# Patient Record
Sex: Female | Born: 1940 | Race: Black or African American | Hispanic: No | Marital: Single
Health system: Southern US, Community
[De-identification: ages and names within clinical notes are randomized; demographics above are authoritative.]

## PROBLEM LIST (undated history)

## (undated) DIAGNOSIS — E119 Type 2 diabetes mellitus without complications: Secondary | ICD-10-CM

## (undated) DIAGNOSIS — R1310 Dysphagia, unspecified: Secondary | ICD-10-CM

## (undated) DIAGNOSIS — I10 Essential (primary) hypertension: Secondary | ICD-10-CM

## (undated) DIAGNOSIS — N390 Urinary tract infection, site not specified: Secondary | ICD-10-CM

## (undated) DIAGNOSIS — E0590 Thyrotoxicosis, unspecified without thyrotoxic crisis or storm: Secondary | ICD-10-CM

## (undated) DIAGNOSIS — F0390 Unspecified dementia without behavioral disturbance: Secondary | ICD-10-CM

---

## 2005-02-27 ENCOUNTER — Ambulatory Visit: Payer: Self-pay | Admitting: Internal Medicine

## 2005-08-04 ENCOUNTER — Inpatient Hospital Stay: Payer: Self-pay | Admitting: Internal Medicine

## 2005-08-04 ENCOUNTER — Other Ambulatory Visit: Payer: Self-pay

## 2013-05-15 ENCOUNTER — Inpatient Hospital Stay: Payer: Self-pay | Admitting: Internal Medicine

## 2013-05-15 LAB — CBC
HCT: 36.4 % (ref 35.0–47.0)
MCH: 25.6 pg — ABNORMAL LOW (ref 26.0–34.0)
MCHC: 33.9 g/dL (ref 32.0–36.0)
MCV: 76 fL — ABNORMAL LOW (ref 80–100)
Platelet: 316 10*3/uL (ref 150–440)
RDW: 14.4 % (ref 11.5–14.5)

## 2013-05-15 LAB — URINALYSIS, COMPLETE
Bacteria: NONE SEEN
Glucose,UR: NEGATIVE mg/dL (ref 0–75)
Ph: 5 (ref 4.5–8.0)
Specific Gravity: 1.025 (ref 1.003–1.030)
WBC UR: 5 /HPF (ref 0–5)

## 2013-05-15 LAB — COMPREHENSIVE METABOLIC PANEL
Albumin: 3.1 g/dL — ABNORMAL LOW (ref 3.4–5.0)
BUN: 27 mg/dL — ABNORMAL HIGH (ref 7–18)
Bilirubin,Total: 0.3 mg/dL (ref 0.2–1.0)
Calcium, Total: 10.5 mg/dL — ABNORMAL HIGH (ref 8.5–10.1)
Chloride: 103 mmol/L (ref 98–107)
Glucose: 190 mg/dL — ABNORMAL HIGH (ref 65–99)
Osmolality: 280 (ref 275–301)
SGPT (ALT): 25 U/L (ref 12–78)
Sodium: 135 mmol/L — ABNORMAL LOW (ref 136–145)
Total Protein: 7.2 g/dL (ref 6.4–8.2)

## 2013-05-15 LAB — TROPONIN I: Troponin-I: <0.02 ng/mL

## 2013-05-15 LAB — PROTIME-INR: Prothrombin Time: 14.3 secs (ref 11.5–14.7)

## 2013-05-16 LAB — LIPID PANEL
Cholesterol: 119 mg/dL (ref 0–200)
HDL Cholesterol: 57 mg/dL (ref 40–60)
Ldl Cholesterol, Calc: 44 mg/dL (ref 0–100)
Triglycerides: 89 mg/dL (ref 0–200)
VLDL Cholesterol, Calc: 18 mg/dL (ref 5–40)

## 2013-05-16 LAB — MAGNESIUM: Magnesium: 1.4 mg/dL — ABNORMAL LOW

## 2013-05-17 LAB — BASIC METABOLIC PANEL
Anion Gap: 3 — ABNORMAL LOW (ref 7–16)
Calcium, Total: 10.3 mg/dL — ABNORMAL HIGH (ref 8.5–10.1)
Co2: 31 mmol/L (ref 21–32)
Creatinine: 0.68 mg/dL (ref 0.60–1.30)
EGFR (African American): >60
EGFR (Non-African Amer.): >60
Glucose: 137 mg/dL — ABNORMAL HIGH (ref 65–99)
Osmolality: 278 (ref 275–301)
Potassium: 3.4 mmol/L — ABNORMAL LOW (ref 3.5–5.1)
Sodium: 138 mmol/L (ref 136–145)

## 2013-05-19 LAB — PROTIME-INR: INR: 1

## 2013-05-19 LAB — MAGNESIUM: Magnesium: 1.5 mg/dL — ABNORMAL LOW

## 2014-04-10 ENCOUNTER — Ambulatory Visit: Payer: Self-pay | Admitting: Ophthalmology

## 2014-04-10 LAB — HEMOGLOBIN: HGB: 13.9 g/dL (ref 12.0–16.0)

## 2014-04-15 ENCOUNTER — Ambulatory Visit: Payer: Self-pay | Admitting: Internal Medicine

## 2014-04-17 ENCOUNTER — Ambulatory Visit: Payer: Self-pay | Admitting: Ophthalmology

## 2014-04-21 LAB — URINALYSIS, COMPLETE
Bilirubin,UR: NEGATIVE
Ketone: NEGATIVE
Nitrite: NEGATIVE
Ph: 7 (ref 4.5–8.0)
Specific Gravity: 1.007 (ref 1.003–1.030)
Squamous Epithelial: 3
WBC UR: 112 /HPF (ref 0–5)

## 2014-04-21 LAB — CBC
HCT: 42.3 % (ref 35.0–47.0)
HGB: 13.4 g/dL (ref 12.0–16.0)
MCH: 24.7 pg — ABNORMAL LOW (ref 26.0–34.0)
MCHC: 31.8 g/dL — ABNORMAL LOW (ref 32.0–36.0)
MCV: 78 fL — ABNORMAL LOW (ref 80–100)
Platelet: 390 10*3/uL (ref 150–440)
RBC: 5.44 10*6/uL — ABNORMAL HIGH (ref 3.80–5.20)
RDW: 14.9 % — AB (ref 11.5–14.5)
WBC: 18.7 10*3/uL — ABNORMAL HIGH (ref 3.6–11.0)

## 2014-04-21 LAB — COMPREHENSIVE METABOLIC PANEL
ALBUMIN: 4.4 g/dL (ref 3.4–5.0)
ALK PHOS: 148 U/L — AB
ALT: 18 U/L
ANION GAP: 9 (ref 7–16)
AST: 11 U/L — AB (ref 15–37)
BILIRUBIN TOTAL: 0.4 mg/dL (ref 0.2–1.0)
BUN: 15 mg/dL (ref 7–18)
CHLORIDE: 100 mmol/L (ref 98–107)
CO2: 29 mmol/L (ref 21–32)
CREATININE: 1.13 mg/dL (ref 0.60–1.30)
Calcium, Total: 9.7 mg/dL (ref 8.5–10.1)
EGFR (Non-African Amer.): 48 — ABNORMAL LOW
GFR CALC AF AMER: 55 — AB
Glucose: 290 mg/dL — ABNORMAL HIGH (ref 65–99)
OSMOLALITY: 287 (ref 275–301)
Potassium: 3 mmol/L — ABNORMAL LOW (ref 3.5–5.1)
SODIUM: 138 mmol/L (ref 136–145)
Total Protein: 8.3 g/dL — ABNORMAL HIGH (ref 6.4–8.2)

## 2014-04-21 LAB — TROPONIN I: Troponin-I: <0.02 ng/mL

## 2014-04-22 ENCOUNTER — Inpatient Hospital Stay: Payer: Self-pay | Admitting: Internal Medicine

## 2014-04-22 LAB — CBC WITH DIFFERENTIAL/PLATELET
BASOS ABS: 0 10*3/uL (ref 0.0–0.1)
Basophil #: 0.2 10*3/uL — ABNORMAL HIGH (ref 0.0–0.1)
Basophil %: 0 %
Basophil %: 0.4 %
EOS ABS: 0 10*3/uL (ref 0.0–0.7)
EOS PCT: 0 %
EOS PCT: 0 %
Eosinophil #: 0 10*3/uL (ref 0.0–0.7)
HCT: 49 % — AB (ref 35.0–47.0)
HCT: 40.8 % (ref 35.0–47.0)
HGB: 15.9 g/dL (ref 12.0–16.0)
HGB: 13.2 g/dL (ref 12.0–16.0)
LYMPHS ABS: 0.7 10*3/uL — AB (ref 1.0–3.6)
Lymphocyte #: 0.8 10*3/uL — ABNORMAL LOW (ref 1.0–3.6)
Lymphocyte %: 1.5 %
Lymphocyte %: 1.7 %
MCH: 24.7 pg — ABNORMAL LOW (ref 26.0–34.0)
MCH: 24.5 pg — ABNORMAL LOW (ref 26.0–34.0)
MCHC: 32.4 g/dL (ref 32.0–36.0)
MCHC: 32.2 g/dL (ref 32.0–36.0)
MCV: 76 fL — ABNORMAL LOW (ref 80–100)
MCV: 76 fL — AB (ref 80–100)
MONO ABS: 2.7 x10 3/mm — AB (ref 0.2–0.9)
MONOS PCT: 6.4 %
Monocyte #: 2.9 x10 3/mm — ABNORMAL HIGH (ref 0.2–0.9)
Monocyte %: 6.2 %
NEUTROS ABS: 39.9 10*3/uL — AB (ref 1.4–6.5)
NEUTROS PCT: 92.3 %
Neutrophil #: 41.5 10*3/uL — ABNORMAL HIGH (ref 1.4–6.5)
Neutrophil %: 91.5 %
Platelet: 393 10*3/uL (ref 150–440)
Platelet: 273 10*3/uL (ref 150–440)
RBC: 6.43 10*6/uL — AB (ref 3.80–5.20)
RBC: 5.37 10*6/uL — AB (ref 3.80–5.20)
RDW: 15.2 % — ABNORMAL HIGH (ref 11.5–14.5)
RDW: 15 % — ABNORMAL HIGH (ref 11.5–14.5)
WBC: 43.3 10*3/uL — ABNORMAL HIGH (ref 3.6–11.0)
WBC: 45.3 10*3/uL — ABNORMAL HIGH (ref 3.6–11.0)

## 2014-04-22 LAB — BASIC METABOLIC PANEL
Anion Gap: 15 (ref 7–16)
BUN: 23 mg/dL — ABNORMAL HIGH (ref 7–18)
CHLORIDE: 102 mmol/L (ref 98–107)
CO2: 21 mmol/L (ref 21–32)
Calcium, Total: 10.2 mg/dL — ABNORMAL HIGH (ref 8.5–10.1)
Creatinine: 1.76 mg/dL — ABNORMAL HIGH (ref 0.60–1.30)
GFR CALC AF AMER: 32 — AB
GFR CALC NON AF AMER: 28 — AB
GLUCOSE: 290 mg/dL — AB (ref 65–99)
OSMOLALITY: 290 (ref 275–301)
Potassium: 3 mmol/L — ABNORMAL LOW (ref 3.5–5.1)
Sodium: 138 mmol/L (ref 136–145)

## 2014-04-22 LAB — TSH: THYROID STIMULATING HORM: 0.381 u[IU]/mL — AB

## 2014-04-22 LAB — T4, FREE: Free Thyroxine: 0.94 ng/dL (ref 0.76–1.46)

## 2014-04-23 LAB — CBC WITH DIFFERENTIAL/PLATELET
Basophil #: 0 10*3/uL (ref 0.0–0.1)
Basophil %: 0 %
EOS ABS: 0 10*3/uL (ref 0.0–0.7)
Eosinophil %: 0 %
HCT: 36.6 % (ref 35.0–47.0)
HGB: 12.2 g/dL (ref 12.0–16.0)
Lymphocyte #: 1.1 10*3/uL (ref 1.0–3.6)
Lymphocyte %: 2.9 %
MCH: 25.3 pg — ABNORMAL LOW (ref 26.0–34.0)
MCHC: 33.3 g/dL (ref 32.0–36.0)
MCV: 76 fL — AB (ref 80–100)
MONO ABS: 3 x10 3/mm — AB (ref 0.2–0.9)
MONOS PCT: 7.7 %
Neutrophil #: 35.1 10*3/uL — ABNORMAL HIGH (ref 1.4–6.5)
Neutrophil %: 89.4 %
Platelet: 239 10*3/uL (ref 150–440)
RBC: 4.8 10*6/uL (ref 3.80–5.20)
RDW: 15.4 % — AB (ref 11.5–14.5)
WBC: 39.3 10*3/uL — ABNORMAL HIGH (ref 3.6–11.0)

## 2014-04-23 LAB — BASIC METABOLIC PANEL
Anion Gap: 11 (ref 7–16)
BUN: 29 mg/dL — AB (ref 7–18)
CHLORIDE: 106 mmol/L (ref 98–107)
CO2: 21 mmol/L (ref 21–32)
Calcium, Total: 9 mg/dL (ref 8.5–10.1)
Creatinine: 1.41 mg/dL — ABNORMAL HIGH (ref 0.60–1.30)
EGFR (Non-African Amer.): 37 — ABNORMAL LOW
GFR CALC AF AMER: 42 — AB
Glucose: 154 mg/dL — ABNORMAL HIGH (ref 65–99)
Osmolality: 285 (ref 275–301)
Potassium: 2.9 mmol/L — ABNORMAL LOW (ref 3.5–5.1)
SODIUM: 138 mmol/L (ref 136–145)

## 2014-04-23 LAB — URINE CULTURE

## 2014-04-24 LAB — CBC WITH DIFFERENTIAL/PLATELET
Basophil #: 0 10*3/uL (ref 0.0–0.1)
Basophil %: 0.1 %
EOS PCT: 0 %
Eosinophil #: 0 10*3/uL (ref 0.0–0.7)
HCT: 44.1 % (ref 35.0–47.0)
HGB: 14.2 g/dL (ref 12.0–16.0)
LYMPHS PCT: 2.6 %
Lymphocyte #: 1 10*3/uL (ref 1.0–3.6)
MCH: 24.5 pg — ABNORMAL LOW (ref 26.0–34.0)
MCHC: 32.2 g/dL (ref 32.0–36.0)
MCV: 76 fL — ABNORMAL LOW (ref 80–100)
Monocyte #: 2.4 x10 3/mm — ABNORMAL HIGH (ref 0.2–0.9)
Monocyte %: 6.3 %
NEUTROS ABS: 34.3 10*3/uL — AB (ref 1.4–6.5)
Neutrophil %: 91 %
PLATELETS: 258 10*3/uL (ref 150–440)
RBC: 5.79 10*6/uL — AB (ref 3.80–5.20)
RDW: 15.1 % — AB (ref 11.5–14.5)
WBC: 37.7 10*3/uL — AB (ref 3.6–11.0)

## 2014-04-24 LAB — BASIC METABOLIC PANEL
ANION GAP: 9 (ref 7–16)
BUN: 14 mg/dL (ref 7–18)
CALCIUM: 9.6 mg/dL (ref 8.5–10.1)
CHLORIDE: 104 mmol/L (ref 98–107)
CO2: 24 mmol/L (ref 21–32)
CREATININE: 0.87 mg/dL (ref 0.60–1.30)
EGFR (Non-African Amer.): >60
GLUCOSE: 171 mg/dL — AB (ref 65–99)
Osmolality: 278 (ref 275–301)
Potassium: 4 mmol/L (ref 3.5–5.1)
Sodium: 137 mmol/L (ref 136–145)

## 2014-04-25 LAB — WBC: WBC: 46.7 10*3/uL — AB (ref 3.6–11.0)

## 2014-04-26 LAB — URINALYSIS, COMPLETE
Bacteria: NONE SEEN
Bilirubin,UR: NEGATIVE
Glucose,UR: >=500 mg/dL (ref 0–75)
NITRITE: NEGATIVE
PH: 6 (ref 4.5–8.0)
RBC,UR: 32 /HPF (ref 0–5)
Specific Gravity: 1.016 (ref 1.003–1.030)
Squamous Epithelial: 2

## 2014-04-26 LAB — CULTURE, BLOOD (SINGLE)

## 2014-04-26 LAB — BASIC METABOLIC PANEL
ANION GAP: 13 (ref 7–16)
BUN: 19 mg/dL — ABNORMAL HIGH (ref 7–18)
CALCIUM: 9 mg/dL (ref 8.5–10.1)
CHLORIDE: 109 mmol/L — AB (ref 98–107)
CO2: 21 mmol/L (ref 21–32)
Creatinine: 1.05 mg/dL (ref 0.60–1.30)
EGFR (African American): >60
GFR CALC NON AF AMER: 52 — AB
Glucose: 181 mg/dL — ABNORMAL HIGH (ref 65–99)
Osmolality: 292 (ref 275–301)
POTASSIUM: 2.7 mmol/L — AB (ref 3.5–5.1)
SODIUM: 143 mmol/L (ref 136–145)

## 2014-04-26 LAB — CBC WITH DIFFERENTIAL/PLATELET
HCT: 34.1 % — ABNORMAL LOW (ref 35.0–47.0)
HGB: 11.3 g/dL — AB (ref 12.0–16.0)
LYMPHS PCT: 7 %
MCH: 24.9 pg — AB (ref 26.0–34.0)
MCHC: 33.1 g/dL (ref 32.0–36.0)
MCV: 75 fL — AB (ref 80–100)
Monocytes: 5 %
Platelet: 252 10*3/uL (ref 150–440)
RBC: 4.52 10*6/uL (ref 3.80–5.20)
RDW: 14.6 % — AB (ref 11.5–14.5)
Segmented Neutrophils: 88 %
WBC: 35.6 10*3/uL — ABNORMAL HIGH (ref 3.6–11.0)

## 2014-04-26 LAB — MAGNESIUM: Magnesium: 1.5 mg/dL — ABNORMAL LOW

## 2014-04-26 LAB — HEPATIC FUNCTION PANEL A (ARMC)
AST: 26 U/L (ref 15–37)
Albumin: 2.2 g/dL — ABNORMAL LOW (ref 3.4–5.0)
Alkaline Phosphatase: 107 U/L
BILIRUBIN DIRECT: 0.1 mg/dL (ref 0.00–0.20)
BILIRUBIN TOTAL: 0.3 mg/dL (ref 0.2–1.0)
SGPT (ALT): 33 U/L
TOTAL PROTEIN: 6 g/dL — AB (ref 6.4–8.2)

## 2014-04-27 LAB — CBC WITH DIFFERENTIAL/PLATELET
HCT: 36.4 % (ref 35.0–47.0)
HGB: 11.8 g/dL — ABNORMAL LOW (ref 12.0–16.0)
LYMPHS PCT: 7 %
MCH: 24.5 pg — AB (ref 26.0–34.0)
MCHC: 32.3 g/dL (ref 32.0–36.0)
MCV: 76 fL — AB (ref 80–100)
Monocytes: 8 %
Platelet: 285 10*3/uL (ref 150–440)
RBC: 4.8 10*6/uL (ref 3.80–5.20)
RDW: 15 % — ABNORMAL HIGH (ref 11.5–14.5)
Segmented Neutrophils: 85 %
WBC: 33.5 10*3/uL — ABNORMAL HIGH (ref 3.6–11.0)

## 2014-04-27 LAB — BASIC METABOLIC PANEL
Anion Gap: 12 (ref 7–16)
BUN: 15 mg/dL (ref 7–18)
CO2: 25 mmol/L (ref 21–32)
Calcium, Total: 9.3 mg/dL (ref 8.5–10.1)
Chloride: 103 mmol/L (ref 98–107)
Creatinine: 1.12 mg/dL (ref 0.60–1.30)
EGFR (African American): 56 — ABNORMAL LOW
EGFR (Non-African Amer.): 48 — ABNORMAL LOW
Glucose: 286 mg/dL — ABNORMAL HIGH (ref 65–99)
Osmolality: 291 (ref 275–301)
POTASSIUM: 2.7 mmol/L — AB (ref 3.5–5.1)
Sodium: 140 mmol/L (ref 136–145)

## 2014-04-27 LAB — MAGNESIUM: MAGNESIUM: 1.3 mg/dL — AB

## 2014-04-28 LAB — CBC WITH DIFFERENTIAL/PLATELET
Bands: 1 %
EOS PCT: 3 %
HCT: 30.7 % — AB (ref 35.0–47.0)
HGB: 10.1 g/dL — ABNORMAL LOW (ref 12.0–16.0)
LYMPHS PCT: 18 %
MCH: 25.1 pg — ABNORMAL LOW (ref 26.0–34.0)
MCHC: 32.9 g/dL (ref 32.0–36.0)
MCV: 76 fL — AB (ref 80–100)
MONOS PCT: 11 %
MYELOCYTE: 1 %
Metamyelocyte: 1 %
PLATELETS: 295 10*3/uL (ref 150–440)
RBC: 4.04 10*6/uL (ref 3.80–5.20)
RDW: 15.2 % — AB (ref 11.5–14.5)
Segmented Neutrophils: 65 %
WBC: 21.1 10*3/uL — AB (ref 3.6–11.0)

## 2014-04-28 LAB — BASIC METABOLIC PANEL
Anion Gap: 6 — ABNORMAL LOW (ref 7–16)
BUN: 17 mg/dL (ref 7–18)
CALCIUM: 9.5 mg/dL (ref 8.5–10.1)
CHLORIDE: 109 mmol/L — AB (ref 98–107)
CREATININE: 1.18 mg/dL (ref 0.60–1.30)
Co2: 26 mmol/L (ref 21–32)
EGFR (African American): 53 — ABNORMAL LOW
EGFR (Non-African Amer.): 45 — ABNORMAL LOW
Glucose: 207 mg/dL — ABNORMAL HIGH (ref 65–99)
Osmolality: 289 (ref 275–301)
Potassium: 3.4 mmol/L — ABNORMAL LOW (ref 3.5–5.1)
SODIUM: 141 mmol/L (ref 136–145)

## 2014-04-28 LAB — MAGNESIUM: Magnesium: 1.5 mg/dL — ABNORMAL LOW

## 2014-04-28 LAB — URINE CULTURE

## 2014-05-16 ENCOUNTER — Ambulatory Visit: Payer: Self-pay | Admitting: Internal Medicine

## 2014-06-01 ENCOUNTER — Inpatient Hospital Stay: Payer: Self-pay | Admitting: Internal Medicine

## 2014-06-01 LAB — URINALYSIS, COMPLETE
BILIRUBIN, UR: NEGATIVE
Bacteria: NONE SEEN
Blood: NEGATIVE
Glucose,UR: NEGATIVE mg/dL (ref 0–75)
Hyaline Cast: 1
KETONE: NEGATIVE
Nitrite: NEGATIVE
PH: 5 (ref 4.5–8.0)
PROTEIN: NEGATIVE
Specific Gravity: 1.009 (ref 1.003–1.030)
Squamous Epithelial: 4

## 2014-06-01 LAB — CBC WITH DIFFERENTIAL/PLATELET
BANDS NEUTROPHIL: 1 %
COMMENT - H1-COM6: NORMAL
Eosinophil: 2 %
HCT: 39 % (ref 35.0–47.0)
HGB: 12.1 g/dL (ref 12.0–16.0)
Lymphocytes: 23 %
MCH: 24 pg — AB (ref 26.0–34.0)
MCHC: 31.1 g/dL — AB (ref 32.0–36.0)
MCV: 77 fL — AB (ref 80–100)
MONOS PCT: 12 %
PLATELETS: 289 10*3/uL (ref 150–440)
RBC: 5.04 10*6/uL (ref 3.80–5.20)
RDW: 15.6 % — ABNORMAL HIGH (ref 11.5–14.5)
SEGMENTED NEUTROPHILS: 55 %
VARIANT LYMPHOCYTE - H1-RLYMPH: 7 %
WBC: 14.1 10*3/uL — ABNORMAL HIGH (ref 3.6–11.0)

## 2014-06-01 LAB — COMPREHENSIVE METABOLIC PANEL
ALBUMIN: 3.1 g/dL — AB (ref 3.4–5.0)
ALK PHOS: 106 U/L
ALT: 28 U/L
AST: 19 U/L (ref 15–37)
Albumin: 2.9 g/dL — ABNORMAL LOW (ref 3.4–5.0)
Alkaline Phosphatase: 111 U/L
BUN: 43 mg/dL — ABNORMAL HIGH (ref 7–18)
BUN: 44 mg/dL — AB (ref 7–18)
Bilirubin,Total: 0.2 mg/dL (ref 0.2–1.0)
Bilirubin,Total: 0.2 mg/dL (ref 0.2–1.0)
CALCIUM: 9 mg/dL (ref 8.5–10.1)
CO2: 29 mmol/L (ref 21–32)
Calcium, Total: 9.4 mg/dL (ref 8.5–10.1)
Chloride: >128 mmol/L — ABNORMAL HIGH (ref 98–107)
Co2: 31 mmol/L (ref 21–32)
Creatinine: 2.3 mg/dL — ABNORMAL HIGH (ref 0.60–1.30)
Creatinine: 2.31 mg/dL — ABNORMAL HIGH (ref 0.60–1.30)
EGFR (African American): 23 — ABNORMAL LOW
EGFR (African American): 23 — ABNORMAL LOW
EGFR (Non-African Amer.): 20 — ABNORMAL LOW
EGFR (Non-African Amer.): 20 — ABNORMAL LOW
GLUCOSE: 215 mg/dL — AB (ref 65–99)
Glucose: 212 mg/dL — ABNORMAL HIGH (ref 65–99)
Potassium: 3.3 mmol/L — ABNORMAL LOW (ref 3.5–5.1)
Potassium: 3.5 mmol/L (ref 3.5–5.1)
SGOT(AST): 23 U/L (ref 15–37)
SGPT (ALT): 28 U/L
Sodium: >160 mmol/L (ref 136–145)
Sodium: >160 mmol/L (ref 136–145)
Total Protein: 6.8 g/dL (ref 6.4–8.2)
Total Protein: 6.9 g/dL (ref 6.4–8.2)

## 2014-06-01 LAB — CBC
HCT: 37.6 % (ref 35.0–47.0)
HGB: 11.8 g/dL — AB (ref 12.0–16.0)
MCH: 24 pg — AB (ref 26.0–34.0)
MCHC: 31.3 g/dL — ABNORMAL LOW (ref 32.0–36.0)
MCV: 77 fL — ABNORMAL LOW (ref 80–100)
Platelet: 286 10*3/uL (ref 150–440)
RBC: 4.9 10*6/uL (ref 3.80–5.20)
RDW: 15.9 % — AB (ref 11.5–14.5)
WBC: 15.1 10*3/uL — AB (ref 3.6–11.0)

## 2014-06-01 LAB — APTT
Activated PTT: 27.1 secs (ref 23.6–35.9)
Activated PTT: 24.3 secs (ref 23.6–35.9)

## 2014-06-01 LAB — TROPONIN I
Troponin-I: 0.02 ng/mL
Troponin-I: 0.02 ng/mL

## 2014-06-01 LAB — PROTIME-INR
INR: 1.2
Prothrombin Time: 14.7 secs (ref 11.5–14.7)

## 2014-06-02 LAB — CBC WITH DIFFERENTIAL/PLATELET
EOS PCT: 2 %
HCT: 40.1 % (ref 35.0–47.0)
HGB: 12 g/dL (ref 12.0–16.0)
LYMPHS PCT: 20 %
MCH: 23.4 pg — AB (ref 26.0–34.0)
MCHC: 29.9 g/dL — AB (ref 32.0–36.0)
MCV: 78 fL — AB (ref 80–100)
Monocytes: 9 %
Platelet: 268 10*3/uL (ref 150–440)
RBC: 5.13 10*6/uL (ref 3.80–5.20)
RDW: 15.8 % — AB (ref 11.5–14.5)
Segmented Neutrophils: 69 %
WBC: 14.9 10*3/uL — ABNORMAL HIGH (ref 3.6–11.0)

## 2014-06-02 LAB — BASIC METABOLIC PANEL
BUN: 39 mg/dL — ABNORMAL HIGH (ref 7–18)
BUN: 38 mg/dL — AB (ref 7–18)
BUN: 33 mg/dL — ABNORMAL HIGH (ref 7–18)
CALCIUM: 9 mg/dL (ref 8.5–10.1)
CALCIUM: 9.6 mg/dL (ref 8.5–10.1)
CALCIUM: 9.3 mg/dL (ref 8.5–10.1)
CO2: 31 mmol/L (ref 21–32)
CREATININE: 2.06 mg/dL — AB (ref 0.60–1.30)
CREATININE: 1.81 mg/dL — AB (ref 0.60–1.30)
Chloride: >128 mmol/L — ABNORMAL HIGH (ref 98–107)
Chloride: >128 mmol/L — ABNORMAL HIGH (ref 98–107)
Chloride: 128 mmol/L — ABNORMAL HIGH (ref 98–107)
Co2: 28 mmol/L (ref 21–32)
Co2: 29 mmol/L (ref 21–32)
Creatinine: 1.9 mg/dL — ABNORMAL HIGH (ref 0.60–1.30)
EGFR (African American): 31 — ABNORMAL LOW
EGFR (African American): 29 — ABNORMAL LOW
EGFR (Non-African Amer.): 23 — ABNORMAL LOW
EGFR (Non-African Amer.): 27 — ABNORMAL LOW
GFR CALC AF AMER: 27 — AB
GFR CALC NON AF AMER: 25 — AB
GLUCOSE: 263 mg/dL — AB (ref 65–99)
Glucose: 205 mg/dL — ABNORMAL HIGH (ref 65–99)
Glucose: 267 mg/dL — ABNORMAL HIGH (ref 65–99)
Potassium: 2.9 mmol/L — ABNORMAL LOW (ref 3.5–5.1)
Potassium: 3.6 mmol/L (ref 3.5–5.1)
Potassium: 3.1 mmol/L — ABNORMAL LOW (ref 3.5–5.1)
Sodium: >160 mmol/L (ref 136–145)

## 2014-06-02 LAB — MAGNESIUM: Magnesium: 2.1 mg/dL

## 2014-06-03 LAB — BASIC METABOLIC PANEL
Anion Gap: 4 — ABNORMAL LOW (ref 7–16)
BUN: 25 mg/dL — AB (ref 7–18)
CALCIUM: 9.3 mg/dL (ref 8.5–10.1)
Chloride: 121 mmol/L — ABNORMAL HIGH (ref 98–107)
Co2: 31 mmol/L (ref 21–32)
Creatinine: 1.71 mg/dL — ABNORMAL HIGH (ref 0.60–1.30)
EGFR (Non-African Amer.): 29 — ABNORMAL LOW
GFR CALC AF AMER: 33 — AB
Glucose: 214 mg/dL — ABNORMAL HIGH (ref 65–99)
OSMOLALITY: 320 (ref 275–301)
POTASSIUM: 3.4 mmol/L — AB (ref 3.5–5.1)
Sodium: 156 mmol/L — ABNORMAL HIGH (ref 136–145)

## 2014-06-03 LAB — CBC WITH DIFFERENTIAL/PLATELET
BASOS PCT: 0.5 %
Basophil #: 0.1 10*3/uL (ref 0.0–0.1)
Eosinophil #: 0.8 10*3/uL — ABNORMAL HIGH (ref 0.0–0.7)
Eosinophil %: 4.3 %
HCT: 40.1 % (ref 35.0–47.0)
HGB: 12.6 g/dL (ref 12.0–16.0)
Lymphocyte #: 3.6 10*3/uL (ref 1.0–3.6)
Lymphocyte %: 19 %
MCH: 24.1 pg — AB (ref 26.0–34.0)
MCHC: 31.4 g/dL — AB (ref 32.0–36.0)
MCV: 77 fL — ABNORMAL LOW (ref 80–100)
Monocyte #: 2 x10 3/mm — ABNORMAL HIGH (ref 0.2–0.9)
Monocyte %: 10.5 %
Neutrophil #: 12.3 10*3/uL — ABNORMAL HIGH (ref 1.4–6.5)
Neutrophil %: 65.7 %
PLATELETS: 256 10*3/uL (ref 150–440)
RBC: 5.22 10*6/uL — ABNORMAL HIGH (ref 3.80–5.20)
RDW: 15.6 % — ABNORMAL HIGH (ref 11.5–14.5)
WBC: 18.8 10*3/uL — ABNORMAL HIGH (ref 3.6–11.0)

## 2014-06-03 LAB — URINE CULTURE

## 2014-06-04 LAB — BASIC METABOLIC PANEL
Anion Gap: 8 (ref 7–16)
BUN: 20 mg/dL — AB (ref 7–18)
CALCIUM: 8.6 mg/dL (ref 8.5–10.1)
Chloride: 114 mmol/L — ABNORMAL HIGH (ref 98–107)
Co2: 27 mmol/L (ref 21–32)
Creatinine: 1.26 mg/dL (ref 0.60–1.30)
EGFR (African American): 48 — ABNORMAL LOW
EGFR (Non-African Amer.): 42 — ABNORMAL LOW
GLUCOSE: 175 mg/dL — AB (ref 65–99)
Osmolality: 303 (ref 275–301)
Potassium: 3.5 mmol/L (ref 3.5–5.1)
SODIUM: 149 mmol/L — AB (ref 136–145)

## 2014-06-05 LAB — BASIC METABOLIC PANEL
Anion Gap: 5 — ABNORMAL LOW (ref 7–16)
BUN: 19 mg/dL — AB (ref 7–18)
Calcium, Total: 8.9 mg/dL (ref 8.5–10.1)
Chloride: 112 mmol/L — ABNORMAL HIGH (ref 98–107)
Co2: 28 mmol/L (ref 21–32)
Creatinine: 1.25 mg/dL (ref 0.60–1.30)
EGFR (African American): 49 — ABNORMAL LOW
EGFR (Non-African Amer.): 42 — ABNORMAL LOW
GLUCOSE: 203 mg/dL — AB (ref 65–99)
Osmolality: 297 (ref 275–301)
Potassium: 3.4 mmol/L — ABNORMAL LOW (ref 3.5–5.1)
Sodium: 145 mmol/L (ref 136–145)

## 2014-06-05 LAB — CBC WITH DIFFERENTIAL/PLATELET
BASOS ABS: 0 10*3/uL (ref 0.0–0.1)
Basophil %: 0.5 %
EOS ABS: 0.6 10*3/uL (ref 0.0–0.7)
Eosinophil %: 6.2 %
HCT: 35.6 % (ref 35.0–47.0)
HGB: 10.9 g/dL — ABNORMAL LOW (ref 12.0–16.0)
Lymphocyte #: 2.7 10*3/uL (ref 1.0–3.6)
Lymphocyte %: 31 %
MCH: 23.5 pg — AB (ref 26.0–34.0)
MCHC: 30.7 g/dL — AB (ref 32.0–36.0)
MCV: 76 fL — ABNORMAL LOW (ref 80–100)
Monocyte #: 1 x10 3/mm — ABNORMAL HIGH (ref 0.2–0.9)
Monocyte %: 11.1 %
Neutrophil #: 4.5 10*3/uL (ref 1.4–6.5)
Neutrophil %: 51.2 %
Platelet: 213 10*3/uL (ref 150–440)
RBC: 4.67 10*6/uL (ref 3.80–5.20)
RDW: 15.7 % — ABNORMAL HIGH (ref 11.5–14.5)
WBC: 8.9 10*3/uL (ref 3.6–11.0)

## 2014-06-06 LAB — CULTURE, BLOOD (SINGLE)

## 2014-06-13 ENCOUNTER — Inpatient Hospital Stay: Payer: Self-pay | Admitting: Internal Medicine

## 2014-06-13 LAB — CBC
HCT: 36.7 % (ref 35.0–47.0)
HGB: 11.8 g/dL — ABNORMAL LOW (ref 12.0–16.0)
MCH: 24.3 pg — ABNORMAL LOW (ref 26.0–34.0)
MCHC: 32.3 g/dL (ref 32.0–36.0)
MCV: 75 fL — ABNORMAL LOW (ref 80–100)
PLATELETS: 303 10*3/uL (ref 150–440)
RBC: 4.87 10*6/uL (ref 3.80–5.20)
RDW: 15.7 % — AB (ref 11.5–14.5)
WBC: 10.3 10*3/uL (ref 3.6–11.0)

## 2014-06-13 LAB — DRUG SCREEN, URINE

## 2014-06-13 LAB — URINALYSIS, COMPLETE
Bilirubin,UR: NEGATIVE
GLUCOSE, UR: NEGATIVE mg/dL (ref 0–75)
Ketone: NEGATIVE
Nitrite: NEGATIVE
PH: 5 (ref 4.5–8.0)
Protein: 100
RBC,UR: 30 /HPF (ref 0–5)
Specific Gravity: 1.012 (ref 1.003–1.030)
WBC UR: 178 /HPF (ref 0–5)

## 2014-06-13 LAB — COMPREHENSIVE METABOLIC PANEL
ALK PHOS: 109 U/L
ALT: 45 U/L
Albumin: 3 g/dL — ABNORMAL LOW (ref 3.4–5.0)
Anion Gap: 12 (ref 7–16)
BUN: 12 mg/dL (ref 7–18)
Bilirubin,Total: 0.2 mg/dL (ref 0.2–1.0)
CALCIUM: 8.6 mg/dL (ref 8.5–10.1)
CO2: 24 mmol/L (ref 21–32)
Chloride: 104 mmol/L (ref 98–107)
Creatinine: 1.28 mg/dL (ref 0.60–1.30)
GFR CALC AF AMER: 52 — AB
GFR CALC NON AF AMER: 43 — AB
Glucose: 176 mg/dL — ABNORMAL HIGH (ref 65–99)
Osmolality: 283 (ref 275–301)
Potassium: 4.5 mmol/L (ref 3.5–5.1)
SGOT(AST): 35 U/L (ref 15–37)
Sodium: 140 mmol/L (ref 136–145)
Total Protein: 6.9 g/dL (ref 6.4–8.2)

## 2014-06-13 LAB — TSH: Thyroid Stimulating Horm: 0.015 u[IU]/mL — ABNORMAL LOW

## 2014-06-13 LAB — ETHANOL

## 2014-06-13 LAB — TROPONIN I

## 2014-06-13 LAB — PROTIME-INR
INR: 1
Prothrombin Time: 13.1 secs (ref 11.5–14.7)

## 2014-06-14 ENCOUNTER — Ambulatory Visit: Payer: Self-pay | Admitting: Neurology

## 2014-06-14 LAB — CBC WITH DIFFERENTIAL/PLATELET
BASOS ABS: 0.1 10*3/uL (ref 0.0–0.1)
Basophil %: 0.9 %
Eosinophil #: 0.5 10*3/uL (ref 0.0–0.7)
Eosinophil %: 4.6 %
HCT: 34.2 % — AB (ref 35.0–47.0)
HGB: 10.7 g/dL — ABNORMAL LOW (ref 12.0–16.0)
LYMPHS PCT: 29 %
Lymphocyte #: 3 10*3/uL (ref 1.0–3.6)
MCH: 23.7 pg — ABNORMAL LOW (ref 26.0–34.0)
MCHC: 31.4 g/dL — ABNORMAL LOW (ref 32.0–36.0)
MCV: 76 fL — AB (ref 80–100)
Monocyte #: 1.5 x10 3/mm — ABNORMAL HIGH (ref 0.2–0.9)
Monocyte %: 14.9 %
NEUTROS ABS: 5.2 10*3/uL (ref 1.4–6.5)
Neutrophil %: 50.6 %
PLATELETS: 249 10*3/uL (ref 150–440)
RBC: 4.53 10*6/uL (ref 3.80–5.20)
RDW: 15.8 % — ABNORMAL HIGH (ref 11.5–14.5)
WBC: 10.3 10*3/uL (ref 3.6–11.0)

## 2014-06-14 LAB — BASIC METABOLIC PANEL
Anion Gap: 7 (ref 7–16)
BUN: 11 mg/dL (ref 7–18)
CHLORIDE: 110 mmol/L — AB (ref 98–107)
Calcium, Total: 8.2 mg/dL — ABNORMAL LOW (ref 8.5–10.1)
Co2: 26 mmol/L (ref 21–32)
Creatinine: 1.04 mg/dL (ref 0.60–1.30)
GFR CALC NON AF AMER: 55 — AB
Glucose: 126 mg/dL — ABNORMAL HIGH (ref 65–99)
Osmolality: 286 (ref 275–301)
POTASSIUM: 3.3 mmol/L — AB (ref 3.5–5.1)
Sodium: 143 mmol/L (ref 136–145)

## 2014-06-14 LAB — POTASSIUM: POTASSIUM: 3.6 mmol/L (ref 3.5–5.1)

## 2014-06-15 ENCOUNTER — Ambulatory Visit: Payer: Self-pay | Admitting: Internal Medicine

## 2014-06-15 LAB — CBC WITH DIFFERENTIAL/PLATELET
BASOS PCT: 0.3 %
Basophil #: 0.1 10*3/uL (ref 0.0–0.1)
EOS ABS: 0 10*3/uL (ref 0.0–0.7)
Eosinophil %: 0.3 %
HCT: 32.8 % — ABNORMAL LOW (ref 35.0–47.0)
HGB: 10.9 g/dL — ABNORMAL LOW (ref 12.0–16.0)
LYMPHS PCT: 12.4 %
Lymphocyte #: 1.9 10*3/uL (ref 1.0–3.6)
MCH: 24.5 pg — AB (ref 26.0–34.0)
MCHC: 33.2 g/dL (ref 32.0–36.0)
MCV: 74 fL — ABNORMAL LOW (ref 80–100)
Monocyte #: 2 x10 3/mm — ABNORMAL HIGH (ref 0.2–0.9)
Monocyte %: 13.2 %
Neutrophil #: 11.4 10*3/uL — ABNORMAL HIGH (ref 1.4–6.5)
Neutrophil %: 73.8 %
Platelet: 252 10*3/uL (ref 150–440)
RBC: 4.45 10*6/uL (ref 3.80–5.20)
RDW: 16.2 % — ABNORMAL HIGH (ref 11.5–14.5)
WBC: 15.4 10*3/uL — AB (ref 3.6–11.0)

## 2014-06-15 LAB — BASIC METABOLIC PANEL
Anion Gap: 9 (ref 7–16)
BUN: 8 mg/dL (ref 7–18)
CALCIUM: 8.3 mg/dL — AB (ref 8.5–10.1)
CO2: 23 mmol/L (ref 21–32)
Chloride: 111 mmol/L — ABNORMAL HIGH (ref 98–107)
Creatinine: 0.91 mg/dL (ref 0.60–1.30)
EGFR (Non-African Amer.): >60
Glucose: 165 mg/dL — ABNORMAL HIGH (ref 65–99)
Osmolality: 287 (ref 275–301)
Potassium: 3.3 mmol/L — ABNORMAL LOW (ref 3.5–5.1)
Sodium: 143 mmol/L (ref 136–145)

## 2014-06-15 LAB — POTASSIUM: Potassium: 3.8 mmol/L (ref 3.5–5.1)

## 2014-06-15 LAB — PHOSPHORUS
Phosphorus: 2 mg/dL — ABNORMAL LOW (ref 2.5–4.9)
Phosphorus: 2.7 mg/dL (ref 2.5–4.9)

## 2014-06-15 LAB — MAGNESIUM: Magnesium: 0.9 mg/dL — ABNORMAL LOW

## 2014-06-16 ENCOUNTER — Ambulatory Visit: Payer: Self-pay | Admitting: Neurology

## 2014-06-16 LAB — CBC WITH DIFFERENTIAL/PLATELET
BASOS PCT: 0.3 %
Basophil #: 0 10*3/uL (ref 0.0–0.1)
EOS ABS: 0.2 10*3/uL (ref 0.0–0.7)
EOS PCT: 1.9 %
HCT: 29.3 % — AB (ref 35.0–47.0)
HGB: 9.3 g/dL — AB (ref 12.0–16.0)
Lymphocyte #: 1.8 10*3/uL (ref 1.0–3.6)
Lymphocyte %: 14.9 %
MCH: 23.7 pg — ABNORMAL LOW (ref 26.0–34.0)
MCHC: 31.9 g/dL — ABNORMAL LOW (ref 32.0–36.0)
MCV: 75 fL — AB (ref 80–100)
MONO ABS: 1.6 x10 3/mm — AB (ref 0.2–0.9)
Monocyte %: 13 %
NEUTROS ABS: 8.3 10*3/uL — AB (ref 1.4–6.5)
NEUTROS PCT: 69.9 %
Platelet: 206 10*3/uL (ref 150–440)
RBC: 3.93 10*6/uL (ref 3.80–5.20)
RDW: 15.7 % — AB (ref 11.5–14.5)
WBC: 11.9 10*3/uL — AB (ref 3.6–11.0)

## 2014-06-16 LAB — BASIC METABOLIC PANEL
ANION GAP: 5 — AB (ref 7–16)
BUN: 7 mg/dL (ref 7–18)
Calcium, Total: 8.2 mg/dL — ABNORMAL LOW (ref 8.5–10.1)
Chloride: 110 mmol/L — ABNORMAL HIGH (ref 98–107)
Co2: 25 mmol/L (ref 21–32)
Creatinine: 0.9 mg/dL (ref 0.60–1.30)
EGFR (African American): >60
EGFR (Non-African Amer.): >60
Glucose: 128 mg/dL — ABNORMAL HIGH (ref 65–99)
OSMOLALITY: 279 (ref 275–301)
Potassium: 3.6 mmol/L (ref 3.5–5.1)
Sodium: 140 mmol/L (ref 136–145)

## 2014-06-16 LAB — MAGNESIUM
Magnesium: 1.5 mg/dL — ABNORMAL LOW
Magnesium: 2 mg/dL

## 2014-06-17 LAB — TRIGLYCERIDES: TRIGLYCERIDES: 132 mg/dL (ref 0–200)

## 2014-06-17 LAB — BASIC METABOLIC PANEL
ANION GAP: 7 (ref 7–16)
BUN: 11 mg/dL (ref 7–18)
CALCIUM: 8.5 mg/dL (ref 8.5–10.1)
CO2: 25 mmol/L (ref 21–32)
CREATININE: 0.88 mg/dL (ref 0.60–1.30)
Chloride: 108 mmol/L — ABNORMAL HIGH (ref 98–107)
EGFR (African American): >60
EGFR (Non-African Amer.): >60
GLUCOSE: 173 mg/dL — AB (ref 65–99)
Osmolality: 283 (ref 275–301)
POTASSIUM: 3.5 mmol/L (ref 3.5–5.1)
Sodium: 140 mmol/L (ref 136–145)

## 2014-06-17 LAB — CBC WITH DIFFERENTIAL/PLATELET
BASOS ABS: 0 10*3/uL (ref 0.0–0.1)
Basophil %: 0.3 %
Eosinophil #: 0 10*3/uL (ref 0.0–0.7)
Eosinophil %: 0.1 %
HCT: 27.4 % — AB (ref 35.0–47.0)
HGB: 8.9 g/dL — AB (ref 12.0–16.0)
LYMPHS ABS: 1.3 10*3/uL (ref 1.0–3.6)
LYMPHS PCT: 11.4 %
MCH: 24.1 pg — ABNORMAL LOW (ref 26.0–34.0)
MCHC: 32.5 g/dL (ref 32.0–36.0)
MCV: 74 fL — AB (ref 80–100)
MONO ABS: 1 x10 3/mm — AB (ref 0.2–0.9)
Monocyte %: 8.6 %
NEUTROS PCT: 79.6 %
Neutrophil #: 9 10*3/uL — ABNORMAL HIGH (ref 1.4–6.5)
PLATELETS: 244 10*3/uL (ref 150–440)
RBC: 3.69 10*6/uL — AB (ref 3.80–5.20)
RDW: 15.5 % — ABNORMAL HIGH (ref 11.5–14.5)
WBC: 11.3 10*3/uL — ABNORMAL HIGH (ref 3.6–11.0)

## 2014-06-17 LAB — PHOSPHORUS: Phosphorus: 2.8 mg/dL (ref 2.5–4.9)

## 2014-06-17 LAB — MAGNESIUM: MAGNESIUM: 1.7 mg/dL — AB

## 2014-06-17 LAB — URINE CULTURE

## 2014-06-17 LAB — T4, FREE: Free Thyroxine: 1.34 ng/dL (ref 0.76–1.46)

## 2014-06-18 LAB — CBC WITH DIFFERENTIAL/PLATELET
Basophil #: 0 10*3/uL (ref 0.0–0.1)
Basophil %: 0.3 %
Eosinophil #: 0 10*3/uL (ref 0.0–0.7)
Eosinophil %: 0.2 %
HCT: 30.1 % — ABNORMAL LOW (ref 35.0–47.0)
HGB: 9.8 g/dL — ABNORMAL LOW (ref 12.0–16.0)
Lymphocyte #: 1.8 10*3/uL (ref 1.0–3.6)
Lymphocyte %: 18.4 %
MCH: 24.2 pg — ABNORMAL LOW (ref 26.0–34.0)
MCHC: 32.5 g/dL (ref 32.0–36.0)
MCV: 75 fL — ABNORMAL LOW (ref 80–100)
Monocyte #: 0.8 x10 3/mm (ref 0.2–0.9)
Monocyte %: 7.9 %
NEUTROS ABS: 7.1 10*3/uL — AB (ref 1.4–6.5)
Neutrophil %: 73.2 %
PLATELETS: 281 10*3/uL (ref 150–440)
RBC: 4.03 10*6/uL (ref 3.80–5.20)
RDW: 15.8 % — ABNORMAL HIGH (ref 11.5–14.5)
WBC: 9.6 10*3/uL (ref 3.6–11.0)

## 2014-06-18 LAB — MAGNESIUM: MAGNESIUM: 1.8 mg/dL

## 2014-06-19 LAB — CLOSTRIDIUM DIFFICILE(ARMC)

## 2014-06-20 LAB — BASIC METABOLIC PANEL
Anion Gap: 6 — ABNORMAL LOW (ref 7–16)
BUN: 18 mg/dL (ref 7–18)
CALCIUM: 8.7 mg/dL (ref 8.5–10.1)
CHLORIDE: 115 mmol/L — AB (ref 98–107)
CO2: 25 mmol/L (ref 21–32)
Creatinine: 0.8 mg/dL (ref 0.60–1.30)
Glucose: 137 mg/dL — ABNORMAL HIGH (ref 65–99)
Osmolality: 295 (ref 275–301)
POTASSIUM: 3 mmol/L — AB (ref 3.5–5.1)
Sodium: 146 mmol/L — ABNORMAL HIGH (ref 136–145)

## 2014-06-20 LAB — POTASSIUM: Potassium: 3.3 mmol/L — ABNORMAL LOW (ref 3.5–5.1)

## 2014-06-20 LAB — SODIUM: Sodium: 146 mmol/L — ABNORMAL HIGH (ref 136–145)

## 2014-06-20 LAB — MAGNESIUM: Magnesium: 1.1 mg/dL — ABNORMAL LOW

## 2014-06-21 ENCOUNTER — Other Ambulatory Visit (HOSPITAL_COMMUNITY): Payer: Medicaid Other

## 2014-06-21 ENCOUNTER — Ambulatory Visit (HOSPITAL_COMMUNITY)
Admission: AD | Admit: 2014-06-21 | Discharge: 2014-06-21 | Disposition: A | Discharge status: Home / Self Care | Payer: Medicare Other | Source: Other Acute Inpatient Hospital | Attending: Internal Medicine | Admitting: Internal Medicine

## 2014-06-21 ENCOUNTER — Encounter: Payer: Self-pay | Admitting: Pulmonary Disease

## 2014-06-21 ENCOUNTER — Inpatient Hospital Stay
Admission: AD | Admit: 2014-06-21 | Discharge: 2014-07-27 | Disposition: A | Discharge status: Skilled Nursing Facility | Payer: Medicaid Other | Source: Ambulatory Visit | Attending: Internal Medicine | Admitting: Internal Medicine

## 2014-06-21 DIAGNOSIS — J9691 Respiratory failure, unspecified with hypoxia: Secondary | ICD-10-CM

## 2014-06-21 DIAGNOSIS — T17908D Unspecified foreign body in respiratory tract, part unspecified causing other injury, subsequent encounter: Secondary | ICD-10-CM

## 2014-06-21 DIAGNOSIS — Z9911 Dependence on respirator [ventilator] status: Secondary | ICD-10-CM | POA: Insufficient documentation

## 2014-06-21 DIAGNOSIS — J9692 Respiratory failure, unspecified with hypercapnia: Secondary | ICD-10-CM

## 2014-06-21 DIAGNOSIS — E46 Unspecified protein-calorie malnutrition: Secondary | ICD-10-CM

## 2014-06-21 DIAGNOSIS — T17908A Unspecified foreign body in respiratory tract, part unspecified causing other injury, initial encounter: Secondary | ICD-10-CM

## 2014-06-21 DIAGNOSIS — J9600 Acute respiratory failure, unspecified whether with hypoxia or hypercapnia: Secondary | ICD-10-CM

## 2014-06-21 DIAGNOSIS — R569 Unspecified convulsions: Secondary | ICD-10-CM | POA: Insufficient documentation

## 2014-06-21 DIAGNOSIS — Z0189 Encounter for other specified special examinations: Secondary | ICD-10-CM

## 2014-06-21 DIAGNOSIS — J9601 Acute respiratory failure with hypoxia: Secondary | ICD-10-CM | POA: Diagnosis not present

## 2014-06-21 DIAGNOSIS — R1310 Dysphagia, unspecified: Secondary | ICD-10-CM

## 2014-06-21 DIAGNOSIS — I639 Cerebral infarction, unspecified: Secondary | ICD-10-CM

## 2014-06-21 DIAGNOSIS — Z4659 Encounter for fitting and adjustment of other gastrointestinal appliance and device: Secondary | ICD-10-CM

## 2014-06-21 DIAGNOSIS — R1314 Dysphagia, pharyngoesophageal phase: Secondary | ICD-10-CM

## 2014-06-21 DIAGNOSIS — Z95828 Presence of other vascular implants and grafts: Secondary | ICD-10-CM

## 2014-06-21 DIAGNOSIS — Z931 Gastrostomy status: Secondary | ICD-10-CM

## 2014-06-21 DIAGNOSIS — J9690 Respiratory failure, unspecified, unspecified whether with hypoxia or hypercapnia: Secondary | ICD-10-CM

## 2014-06-21 DIAGNOSIS — Z431 Encounter for attention to gastrostomy: Secondary | ICD-10-CM

## 2014-06-21 DIAGNOSIS — Z930 Tracheostomy status: Secondary | ICD-10-CM

## 2014-06-21 DIAGNOSIS — R509 Fever, unspecified: Secondary | ICD-10-CM

## 2014-06-21 HISTORY — DX: Urinary tract infection, site not specified: N39.0

## 2014-06-21 HISTORY — DX: Unspecified dementia, unspecified severity, without behavioral disturbance, psychotic disturbance, mood disturbance, and anxiety: F03.90

## 2014-06-21 HISTORY — DX: Type 2 diabetes mellitus without complications: E11.9

## 2014-06-21 HISTORY — DX: Essential (primary) hypertension: I10

## 2014-06-21 HISTORY — DX: Thyrotoxicosis, unspecified without thyrotoxic crisis or storm: E05.90

## 2014-06-21 HISTORY — DX: Dysphagia, unspecified: R13.10

## 2014-06-21 LAB — COMPREHENSIVE METABOLIC PANEL
ALK PHOS: 71 U/L (ref 39–117)
ALT: 19 U/L (ref 0–35)
ANION GAP: 8 (ref 5–15)
AST: 27 U/L (ref 0–37)
Albumin: 2.3 g/dL — ABNORMAL LOW (ref 3.5–5.2)
BUN: 10 mg/dL (ref 6–23)
CHLORIDE: 104 meq/L (ref 96–112)
CO2: 27 mEq/L (ref 19–32)
Calcium: 9 mg/dL (ref 8.4–10.5)
Creatinine, Ser: 0.61 mg/dL (ref 0.50–1.10)
GFR calc non Af Amer: 87 mL/min — ABNORMAL LOW (ref >90)
GLUCOSE: 145 mg/dL — AB (ref 70–99)
POTASSIUM: 3.9 meq/L (ref 3.7–5.3)
Sodium: 139 mEq/L (ref 137–147)
Total Protein: 5.7 g/dL — ABNORMAL LOW (ref 6.0–8.3)

## 2014-06-21 LAB — BLOOD GAS, ARTERIAL
Acid-Base Excess: 0.5 mmol/L (ref 0.0–2.0)
Bicarbonate: 24.1 mEq/L — ABNORMAL HIGH (ref 20.0–24.0)
FIO2: 0.3 %
O2 SAT: 98.7 %
PCO2 ART: 35.2 mmHg (ref 35.0–45.0)
PEEP/CPAP: 5 cmH2O
PO2 ART: 152 mmHg — AB (ref 80.0–100.0)
Patient temperature: 98.6
RATE: 14 resp/min
TCO2: 25.1 mmol/L (ref 0–100)
VT: 400 mL
pH, Arterial: 7.449 (ref 7.350–7.450)

## 2014-06-21 LAB — CBC
HCT: 26.4 % — ABNORMAL LOW (ref 36.0–46.0)
Hemoglobin: 9.2 g/dL — ABNORMAL LOW (ref 12.0–15.0)
MCH: 24.3 pg — AB (ref 26.0–34.0)
MCHC: 34.8 g/dL (ref 30.0–36.0)
MCV: 69.7 fL — AB (ref 78.0–100.0)
PLATELETS: 352 10*3/uL (ref 150–400)
RBC: 3.79 MIL/uL — AB (ref 3.87–5.11)
RDW: 15.7 % — ABNORMAL HIGH (ref 11.5–15.5)
WBC: 6.7 10*3/uL (ref 4.0–10.5)

## 2014-06-21 LAB — BASIC METABOLIC PANEL
Anion Gap: 6 — ABNORMAL LOW (ref 7–16)
BUN: 12 mg/dL (ref 7–18)
CALCIUM: 8.8 mg/dL (ref 8.5–10.1)
Chloride: 109 mmol/L — ABNORMAL HIGH (ref 98–107)
Co2: 26 mmol/L (ref 21–32)
Creatinine: 0.77 mg/dL (ref 0.60–1.30)
EGFR (African American): >60
Glucose: 182 mg/dL — ABNORMAL HIGH (ref 65–99)
OSMOLALITY: 286 (ref 275–301)
Potassium: 3.1 mmol/L — ABNORMAL LOW (ref 3.5–5.1)
Sodium: 141 mmol/L (ref 136–145)

## 2014-06-21 LAB — PHOSPHORUS
PHOSPHORUS: 2 mg/dL — AB (ref 2.5–4.9)
Phosphorus: 2.6 mg/dL (ref 2.3–4.6)

## 2014-06-21 LAB — HEMOGLOBIN A1C
Hgb A1c MFr Bld: 6.8 % — ABNORMAL HIGH (ref <5.7)
Mean Plasma Glucose: 148 mg/dL — ABNORMAL HIGH (ref <117)

## 2014-06-21 LAB — PROCALCITONIN: Procalcitonin: 9.09 ng/mL

## 2014-06-21 LAB — MAGNESIUM
Magnesium: 1.4 mg/dL — ABNORMAL LOW
Magnesium: 1.5 mg/dL (ref 1.5–2.5)

## 2014-06-21 NOTE — Consult Note (Signed)
   Name: Michele Guerra MRN: [id number] DOB: 06/05/1941    ADMISSION DATE:  06/21/2014 CONSULTATION DATE:  06/21/2014  REFERRING MD :  Dr. Sharyon MedicusHijazi  CHIEF COMPLAINT:  VDRF  BRIEF PATIENT DESCRIPTION: 73 year old [facility] and intubated for status epilepticus. She failed extubation twice while there, and [facility] PCCM contacted for ventilator assistance.   SIGNIFICANT EVENTS  9/29 - [facility]> intubated 10/2 - failed extubation 10/5 - began following commands, failed extubation.  [facility]7 - [facility]  STUDIES:  10/2 CT head > no acute intracranial abnormality, chronic vascular changes and atrophy.   LINES/TUBES OETT 9/29 >>> PICC 10/2 >>>  HISTORY OF PRESENT ILLNESS:  73 year old female with PMH as outlined below, which includes severe dementia, HTN, DM [facility] with seizures. Prior to that she had an admission for UTI and hypernatremia. 9/29 she was in status epilepticus and intubated in ED. Once her seizures were well controlled with AEDs they attempted to wean her from the vent which was unsuccessful x2 (10/2 and 10/5). Developed c-diff. She was otherwise stable. 10/7 [facility] for further evaluation. It was thought that she may require tracheostomy. PCCM asked to evaluate for ventilator management.   PAST MEDICAL HISTORY :   has a past medical history of Dementia; Dysphagia; HTN (hypertension); Diabetes mellitus; Recurrent UTI; and Hyperthyroidism.  has no past surgical history on file. Prior to Admission medications   Not on File   Allergies not on file  FAMILY HISTORY:  family history is not on file. SOCIAL HISTORY:    REVIEW OF SYSTEMS:  Unable, intubated  INTERVAL: Tolerated PS 5/5 briefly today   VITAL SIGNS:  10/7 0800 > 88, 16, 157/79, 100%  PHYSICAL EXAMINATION: General:  Female of normal body habitus in NAD on ventilator Neuro:  Sedated, RASS  -3 HEENT:  [place]/AT, ETT in place, No JVD noted Cardiovascular:  RRR, SEM 3/6 Lungs:  Clear anterior breath sounds. Unlabored, synchronous with vent.  Abdomen:  Soft, non-distended, BS normoactive.  Musculoskeletal:  No acute deformity.  Skin:  Intact  No results found for this basename: NA, K, CL, CO2, BUN, CREATININE, GLUCOSE,  in the last 168 hours No results found for this basename: HGB, HCT, WBC, PLT,  in the last 168 hours No results found.  ASSESSMENT / PLAN:  Acute Respiratory Failure s/p seizures - Full vent support (VC 400/16/30%/5) - CXR in AM - VAP prevention per protocol - Attempt WUA and SBT 10/8 AM - Goal is SBT for 1 hour, then check ABG - PRN albuterol - Taper prednisone to off - Hopeful she will be able to extubate and avoid trach  Joneen RoachPaul Hoffman, ACNP [place] [facility]Critical [facility] Pager [phone] or [phone]   PCCM ATTENDING: I have interviewed and examined the patient and reviewed the database. I have formulated the assessment and plan as reflected in the note above with amendments made by me.  Billy Fischeravid Cherene Dobbins, MD;  PCCM service; Mobile [phone]

## 2014-06-22 ENCOUNTER — Other Ambulatory Visit (HOSPITAL_COMMUNITY): Payer: Medicaid Other

## 2014-06-22 DIAGNOSIS — J9601 Acute respiratory failure with hypoxia: Secondary | ICD-10-CM | POA: Diagnosis not present

## 2014-06-22 LAB — BLOOD GAS, ARTERIAL
Acid-Base Excess: 2.7 mmol/L — ABNORMAL HIGH (ref 0.0–2.0)
Acid-Base Excess: 2.8 mmol/L — ABNORMAL HIGH (ref 0.0–2.0)
Bicarbonate: 26.6 mEq/L — ABNORMAL HIGH (ref 20.0–24.0)
Bicarbonate: 26.5 mEq/L — ABNORMAL HIGH (ref 20.0–24.0)
FIO2: 0.24 %
FIO2: 0.3 %
LHR: 15 {breaths}/min
MECHVT: 400 mL
Mode: POSITIVE
O2 SAT: 98.5 %
O2 Saturation: 98.9 %
PATIENT TEMPERATURE: 98.6
PATIENT TEMPERATURE: 98.6
PEEP/CPAP: 5 cmH2O
PEEP: 5 cmH2O
PH ART: 7.452 — AB (ref 7.350–7.450)
PO2 ART: 104 mmHg — AB (ref 80.0–100.0)
PRESSURE SUPPORT: 5 cmH2O
TCO2: 27.8 mmol/L (ref 0–100)
TCO2: 27.6 mmol/L (ref 0–100)
pCO2 arterial: 39.7 mmHg (ref 35.0–45.0)
pCO2 arterial: 38.4 mmHg (ref 35.0–45.0)
pH, Arterial: 7.44 (ref 7.350–7.450)
pO2, Arterial: 119 mmHg — ABNORMAL HIGH (ref 80.0–100.0)

## 2014-06-22 LAB — LACTIC ACID, PLASMA: LACTIC ACID, VENOUS: 1 mmol/L (ref 0.5–2.2)

## 2014-06-22 LAB — TSH: TSH: 0.017 u[IU]/mL — ABNORMAL LOW (ref 0.350–4.500)

## 2014-06-22 LAB — VITAMIN D 25 HYDROXY (VIT D DEFICIENCY, FRACTURES): Vit D, 25-Hydroxy: 38 ng/mL (ref 30–89)

## 2014-06-22 LAB — MAGNESIUM: MAGNESIUM: 2 mg/dL (ref 1.5–2.5)

## 2014-06-22 NOTE — Progress Notes (Signed)
Name: Michele Guerra MRN: [id number] DOB: 10/07/1940    ADMISSION DATE:  06/21/2014 CONSULTATION DATE:  06/21/2014  REFERRING MD :  Dr. Sharyon MedicusHijazi  CHIEF COMPLAINT:  VDRF  BRIEF PATIENT DESCRIPTION: 73 year old [facility] and intubated for status epilepticus. She failed extubation twice while there, and [facility] PCCM contacted for ventilator assistance.   SIGNIFICANT EVENTS  [facility]29 - [facility]> intubated 10/2 - failed extubation 10/5 - began following commands, failed extubation.  10[facility]7 [facility]: vent mechanics look good but still not awake enough to protect airway.   STUDIES:  10/2 CT head > no acute intracranial abnormality, chronic vascular changes and atrophy.   LINES/TUBES OETT 9/29 >>> PICC 10/2 >>>   INTERVAL: Working on weaning   VITAL SIGNS: Reviewed   PHYSICAL EXAMINATION: General:  Female of normal body habitus in NAD on ventilator Neuro:  Sedated, RASS -3 HEENT:  [place]/AT, ETT in place, No JVD noted Cardiovascular:  RRR, SEM 3/6 Lungs:  Clear anterior breath sounds. Unlabored, synchronous with vent.  Abdomen:  Soft, non-distended, BS normoactive.  Musculoskeletal:  No acute deformity.  Skin:  Intact   Recent Labs Lab 06/21/14 1539  NA 139  K 3.9  CL 104  CO2 27  BUN 10  CREATININE 0.61  GLUCOSE 145*    Recent Labs Lab 06/21/14 1539  HGB 9.2*  HCT 26.4*  WBC 6.7  PLT 352   ABG    Component Value Date/Time   PHART 7.452* 06/22/2014 1055   PCO2ART 38.4 06/22/2014 1055   PO2ART 119.0* 06/22/2014 1055   HCO3 26.5* 06/22/2014 1055   TCO2 27.6 06/22/2014 1055   O2SAT 98.9 06/22/2014 1055    Dg Chest Port 1 View  06/21/2014   CLINICAL DATA:  Respiratory failure. Hypertension. Diabetes. Dementia.  EXAM: PORTABLE CHEST - 1 VIEW  COMPARISON:  06/19/2014  FINDINGS: The patient is rotated to the Left on today's radiograph, reducing diagnostic sensitivity and specificity.  Endotracheal tube is 10 mm above the carina. Consider retracting 1.5 cm.  Continued left basilar airspace opacity with obscuration of left hemidiaphragm. Heart size within normal limits.  Right central line tip: Lower SVC. A nasogastric tube enters the stomach.  IMPRESSION: 1. Stable appearance is chest. The endotracheal tube is only 1 cm above the carina, consider retracting 1.5 cm. 2. Stable left lower lobe airspace opacity, nonspecific, potentially from atelectasis, layering pleural effusion, or pneumonia.   Electronically Signed   By: Herbie BaltimoreWalt  Liebkemann M.D.   On: 06/21/2014 16:42   Dg Abd Portable 1v  06/21/2014   CLINICAL DATA:  Orogastric tube placement.  Hypertension.  Diabetes.  EXAM: PORTABLE ABDOMEN - 1 VIEW  COMPARISON:  04/27/2014  FINDINGS: Orogastric tube is in the stomach with tip in the stomach body. The stomach appears somewhat distended given the curvature of the tube.  Retrocardiac airspace opacity, retrocardiac airspace opacity.  IMPRESSION: 1. Orogastric tube tip is in the mildly distended stomach body. 2. Left lower lobe airspace opacity.   Electronically Signed   By: Herbie BaltimoreWalt  Liebkemann M.D.   On: 06/21/2014 16:43  PCXR Left base volume loss  ASSESSMENT / PLAN:  Acute Respiratory Failure s/p seizures HCAP vs aspiration  Severe dementia HTN  DM  Cdiff colitis    Discussion  Gas exchange looks good. Mechanics are favorable but MS prevents extubation at this point.   recs - PSV today, d/c propofol -->hope for extubation  10/9 if MS allows  - VAP prevention per protocol - abx per primary service  - PRN albuterol - Taper prednisone to off  PCCM ATTENDING: I have interviewed and examined the patient and reviewed the database. I have formulated the assessment and plan as reflected in the note above with amendments made by me.   Discussed with care team and Dr Felisa Bonier.   She is able to pass SBT from mechanics perspective but is too somnolent to extubate. CT head planned today.  Work in PSV mode as tolerated. Minimize sedation. Repeat SBT/WUA in AM 10/09  Billy Fischer, MD ; [facility] [phone].  After 5:30 PM or weekends, call [phone]

## 2014-06-22 NOTE — Progress Notes (Signed)
Admitted: 06/21/2014 Subjective: Pt unable to participate in ROS 2/2 encephalopathy.  Objective: Vital signs in last 24 hours:   Tmax:   98.74F HR:   78-101 RR:   11-16 BP:   123/68-158/69 Pulse Ox:  100% on Vent: AC/VC 15/Vt 400/FIO2 30%/PEEP 5 BG:   205-226 I/O:   1480/1850 LINE/FOLEY:  PICC/FOLEY   Physical Exam: HEENT: Michele Guerra/AT, PERRL bilaterally, unable to test EOM 2/2 encephalopathy, ETT in place and secure, Sclera anicteric  NECK: Supple, No LAD, JVD Lungs: CTAb, no increase WOB, symmetrical chest rise with vent CV: Normal S1, S2, grade 3/6 SEM best heard at LSB ABD:S/NT/ND +BS X 4. No grimace with deep palpation EXT: no CCE bilaterally, pulses 2+ NEURO: unable to test CN 2/2 encephalopathy, appears sedated despite no continuous sedation Skin: Warm, dry and intact  Lab Results  Recent Labs  06/21/14 1539  WBC 6.7  HGB 9.2*  HCT 26.4*  NA 139  K 3.9  CL 104  CO2 27  BUN 10  CREATININE 0.61   Liver Panel  Recent Labs  06/21/14 1539  PROT 5.7*  ALBUMIN 2.3*  AST 27  ALT 19  ALKPHOS 71  BILITOT <0.2*   Sedimentation Rate No results found for this basename: ESRSEDRATE,  in the last 72 hours C-Reactive Protein No results found for this basename: CRP,  in the last 72 hours  Microbiology: No results found for this or any previous visit (from the past 240 hour(s)).  Studies/Results: Ct Head Wo Contrast  06/22/2014   CLINICAL DATA:  73 year old female with altered mental status. Unresponsive patient.  EXAM: CT HEAD WITHOUT CONTRAST  TECHNIQUE: Contiguous axial images were obtained from the base of the skull through the vertex without intravenous contrast.  COMPARISON:  Multiple priors, most recently 06/16/2014.  FINDINGS: Again noted is severe cerebral and cerebellar atrophy with some ex vacuo dilatation of the ventricular system. However, there is also rounding of the temporal horns, rounding of the third ventricle and dilatation of the fourth ventricle, all  of which appears chronic compared to prior studies, and are suggestive of underlying chronic communicating hydrocephalus. Patchy and confluent areas of decreased attenuation are noted throughout the deep and periventricular white matter of the cerebral hemispheres bilaterally, compatible with chronic microvascular ischemic disease. No acute intracranial abnormalities. Specifically, no evidence of acute/subacute ischemia, no acute intracranial hemorrhage, no mass, mass effect or other abnormal intra or extra-axial fluid collections. No acute displaced skull fractures are identified. Mastoids are well pneumatized bilaterally. Multifocal mucosal thickening in the ethmoid sinuses bilaterally, frontal sinuses bilaterally, and some opacification of the left frontal sinus and frontoethmoidal recess, similar to the prior examination. 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  IMPRESSION: 1. No acute findings. 2. 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 3. Paranasal sinus disease, as above, similar to prior examination.   Electronically Signed   By: Michele Guerra M.D.   On: 06/22/2014 13:19   Dg Chest Port 1 View  06/21/2014   CLINICAL DATA:  Respiratory failure. Hypertension. Diabetes. Dementia.  EXAM: PORTABLE CHEST - 1 VIEW  COMPARISON:  06/19/2014  FINDINGS: The patient is rotated to the Left on today's radiograph, reducing diagnostic sensitivity and specificity. Endotracheal tube is 10 mm above the carina. Consider retracting 1.5 cm.  Continued left basilar airspace opacity with obscuration of left hemidiaphragm. Heart size within normal  limits.  Right central line tip: Lower SVC. A nasogastric tube enters the stomach.  IMPRESSION: 1. Stable appearance is chest. The endotracheal tube is only 1 cm above the carina,  consider retracting 1.5 cm. 2. Stable left lower lobe airspace opacity, nonspecific, potentially from atelectasis, layering pleural effusion, or pneumonia.   Electronically Signed   By: Michele BaltimoreWalt  Guerra M.D.   On: 06/21/2014 16:42   Dg Abd Portable 1v  06/21/2014   CLINICAL DATA:  Orogastric tube placement.  Hypertension.  Diabetes.  EXAM: PORTABLE ABDOMEN - 1 VIEW  COMPARISON:  04/27/2014  FINDINGS: Orogastric tube is in the stomach with tip in the stomach body. The stomach appears somewhat distended given the curvature of the tube.  Retrocardiac airspace opacity, retrocardiac airspace opacity.  IMPRESSION: 1. Orogastric tube tip is in the mildly distended stomach body. 2. Left lower lobe airspace opacity.   Electronically Signed   By: Michele BaltimoreWalt  Guerra M.D.   On: 06/21/2014 16:43    Medications: I have reviewed the patient's current medications.  Assessment/Plan:  VDRF/Aspiration Pneumonia/HCAP: -continue vent weaning protocol -taper steroids to off; prednisone will complete 10/13 -Fentanyl 50mcg IV q 2h prn for agitation on the vent -cont VAP prevention protocol -pulm following -prn pCXR, ABG and BD; lab survey weekly -hold PT/OT eval and tx for now -continue abx (Meropenem to complete 10/14)  Severe Vascular Dementia/Acute Toxic Encephalopathy/Anoxic Brain Injury: -d/w son that we are unsure of how much recovery of brain function pt will have, time will tell; he stated that in the past pt has taken a "long time" to recover from sedatives -mental status serves as a barrier to extubation given good air exchange mechanics -son was unaware of PTA anoxic brain injury diagnosis, explained and now he understands additional barrier  Hypomagnesium: resolved -continue mag replacment protocol prn; goal level 2.0  Seizure disorder: -check keppra level, result pending -continue anticonvulsants: Keppra and Vimpat; prn Ativan IV if breakthrough seizure and order to notify physician -continue  seizure and aspiration precautions -no seizures noted this admission  -CT of Head w/o contrast, negative for acute intracranial pathology, result as noted below: 1. No acute findings.  2. 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  3. Paranasal sinus disease, as above, similar to prior examination.  Hyperthyroidism, contributor of Tachycardia: -checked TFT panel, T3/T4 levels likely normalized despite depressed TSH 2/2 infection, anti-convulsants, steroids -increased methimazole to 15mg  PO q8h and recheck panel  in 2-4 weeks -added coreg 25mg  PO BID to assist with tachycardia, malignant HTN and CAD -son stated that pt was on Synthroid PTA, may be able to d/c methimazole at next panel recheck  Malignant HTN: -titrate anti-hypertensive agents to goal and tolerance -long standing problem per son  DMII, uncontrolled: (hbg A1c 6.8) -continue low dose Novolog SSI -continue Plavix, Statin, and add ARB (unsure of ACE-I tolerance) for renal protection -may benefit from low dose basal insulin, monitor BG curve after steroids are discontinued  Vitamin D Def: -ergocalciferol 50K IU q weekly, recheck level in 4 weeks and adjust frequency accordingly  CDIFF, refractory: -Day #2/10 Vanc 125mg  PO q 6h x 10 days and Flagyl 500mg  IV q 8h -assess for fecal tube removal each shift    Acute on chronic pansinusitis: -abx as noted above and add Flonase daily  Anemia of chronic disease and iron def: -checked iron panel, recommend tx as  below and recheck panel in 3 months and adjust tx accordingly -ferrous sulfate 325mg  PO BID + vitamin C 500mg  PO BID  PEM, severe/Generalized Weakness: -RD following, Vital 1.5 TF tra 76ml/h + 50ml FWF q 2h and vitamin supplements -ST tx plan when mentation improves -PT/OT eval and tx on hold given  severe encephalopathy and VDRF, reassess weekly for participation ability  GI proph: probiotic/pepcid VTE proph: lovenox 40mg  [place] daily Code Status: Full code Family Communication: spoke to MPOA/Son Macky Lower via phone as he lives in [place] for about 45 minutes. Discussed PTA events and functioning level, treatment plan and prognosis is full detail.  All questions answered and MPOA in agreement with treatment plan.  Critical Care Time: 35 minutes   LOS: 1 day    Michele Guerra Y Toler 06/22/2014, 2:24 PM

## 2014-06-23 DIAGNOSIS — J9601 Acute respiratory failure with hypoxia: Secondary | ICD-10-CM | POA: Diagnosis not present

## 2014-06-23 LAB — FERRITIN: Ferritin: 152 ng/mL (ref 10–291)

## 2014-06-23 LAB — IRON AND TIBC
Iron: 29 ug/dL — ABNORMAL LOW (ref 42–135)
SATURATION RATIOS: 19 % — AB (ref 20–55)
TIBC: 151 ug/dL — AB (ref 250–470)
UIBC: 122 ug/dL — ABNORMAL LOW (ref 125–400)

## 2014-06-23 LAB — T3, FREE: T3, Free: 2.6 pg/mL (ref 2.3–4.2)

## 2014-06-23 LAB — T4, FREE: FREE T4: 0.92 ng/dL (ref 0.80–1.80)

## 2014-06-23 LAB — TSH: TSH: 0.013 u[IU]/mL — ABNORMAL LOW (ref 0.350–4.500)

## 2014-06-23 NOTE — Progress Notes (Signed)
Name: Michele Guerra MRN: [id number] DOB: 03/19/1941    ADMISSION DATE:  06/21/2014 CONSULTATION DATE:  06/21/2014  REFERRING MD :  Michele Guerra  CHIEF COMPLAINT:  VDRF  BRIEF PATIENT DESCRIPTION: 73 year old female admitted to Michele Guerra and intubated for status epilepticus. She failed extubation twice while there, and was transferred to Michele Guerra for further evaluation. Michele Guerra contacted for ventilator assistance.   SIGNIFICANT EVENTS  9/29 - admitted to Michele Gu[facility] for status> intubated 10/2 - failed extubation 10/5 - began following commands, failed extubation.  10/7 - transfer to [facility] 10/8: vent mechanics look good but still not awake enough to protect airway.  10/9 apneic during waning trials. No sedation over night.  STUDIES:  10/2 CT head > no acute intracranial abnormality, chronic vascular changes and atrophy.   LINES/TUBES OETT 9/29 >>> PICC 10/2 >>>   INTERVAL: Failed weaning 10/9   VITAL SIGNS: Vital signs reviewed. Abnormal values will appear under impression plan section.     PHYSICAL EXAMINATION: General:  Female of normal body habitus in NAD on ventilator Neuro:  , RASS -3 despite no sedation. , grimaces to pain HEENT:  [place]/AT, ETT in place, No JVD noted Cardiovascular:  RRR, SEM 2/6 Lungs:  Clear anterior breath sounds. Unlabored, synchronous with vent. On full support Abdomen:  Soft, non-distended, BS normoactive. TF running Musculoskeletal:  No acute deformity.  Skin:  Intact   Recent Labs Lab 06/21/14 1539  NA 139  K 3.9  CL 104  CO2 27  BUN 10  CREATININE 0.61  GLUCOSE 145*    Recent Labs Lab 06/21/14 1539  HGB 9.2*  HCT 26.4*  WBC 6.7  PLT 352   ABG    Component Value Date/Time   PHART 7.452* 06/22/2014 1055   PCO2ART 38.4 06/22/2014 1055   PO2ART 119.0* 06/22/2014 1055   HCO3 26.5* 06/22/2014 1055   TCO2 27.6 06/22/2014 1055   O2SAT 98.9 06/22/2014 1055    Ct Head Wo Contrast  06/22/2014   CLINICAL DATA:  73 year old female with altered mental  status. Unresponsive patient.  EXAM: CT HEAD WITHOUT CONTRAST  TECHNIQUE: Contiguous axial images were obtained from the base of the skull through the vertex without intravenous contrast.  COMPARISON:  Multiple priors, most recently 06/16/2014.  FINDINGS: Again noted is severe cerebral and cerebellar atrophy with some ex vacuo dilatation of the ventricular system. However, there is also rounding of the temporal horns, rounding of the third ventricle and dilatation of the fourth ventricle, all of which appears chronic compared to prior studies, and are suggestive of underlying chronic communicating hydrocephalus. Patchy and confluent areas of decreased attenuation are noted throughout the deep and periventricular white matter of the cerebral hemispheres bilaterally, compatible with chronic microvascular ischemic disease. No acute intracranial abnormalities. Specifically, no evidence of acute/subacute ischemia, no acute intracranial hemorrhage, no mass, mass effect or other abnormal intra or extra-axial fluid collections. No acute displaced skull fractures are identified. Mastoids are well pneumatized bilaterally. Multifocal mucosal thickening in the ethmoid sinuses bilaterally, frontal sinuses bilaterally, and some opacification of the left frontal sinus and frontoethmoidal recess, similar to the prior examination. 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  IMPRESSION: 1. No acute findings. 2. 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 3. Paranasal sinus disease, as above, similar to prior examination.   Electronically Signed   By: Michele Boomaniel  Entrikin M.D.   On: 06/22/2014 13:19   Dg Chest Port 1 View  06/21/2014   CLINICAL DATA:  Respiratory failure. Hypertension. Diabetes. Dementia.  EXAM: PORTABLE CHEST - 1 VIEW   COMPARISON:  06/19/2014  FINDINGS: The patient is rotated to the Left on today's radiograph, reducing diagnostic sensitivity and specificity. Endotracheal tube is 10 mm above the carina. Consider retracting 1.5 cm.  Continued left basilar airspace opacity with obscuration of left hemidiaphragm. Heart size within normal limits.  Right central line tip: Lower SVC. A nasogastric tube enters the stomach.  IMPRESSION: 1. Stable appearance is chest. The endotracheal tube is only 1 cm above the carina, consider retracting 1.5 cm. 2. Stable left lower lobe airspace opacity, nonspecific, potentially from atelectasis, layering pleural effusion, or pneumonia.   Electronically Signed   By: Michele BaltimoreWalt  Liebkemann M.D.   On: 06/21/2014 16:42   Dg Abd Portable 1v  06/21/2014   CLINICAL DATA:  Orogastric tube placement.  Hypertension.  Diabetes.  EXAM: PORTABLE ABDOMEN - 1 VIEW  COMPARISON:  04/27/2014  FINDINGS: Orogastric tube is in the stomach with tip in the stomach body. The stomach appears somewhat distended given the curvature of the tube.  Retrocardiac airspace opacity, retrocardiac airspace opacity.  IMPRESSION: 1. Orogastric tube tip is in the mildly distended stomach body. 2. Left lower lobe airspace opacity.   Electronically Signed   By: Michele BaltimoreWalt  Liebkemann M.D.   On: 06/21/2014 16:43  PCXR Left base volume loss  ASSESSMENT / PLAN:  Acute Respiratory Failure s/p seizures HCAP vs aspiration  Severe dementia HTN  DM  Cdiff colitis    Discussion  Gas exchange looks good. Mechanics are favorable but MS prevents extubation at this point.   recs -hope for extubation 10/9 if MS allows, Failed weaning due to apnea eeven off sedation  - VAP prevention per protocol - abx per primary service  - PRN albuterol - Taper prednisone to off -possible trach in future  Michele Guerra Michele Guerra Pager [phone] till 3 pm If no answer page [phone] 06/23/2014, 10:27 AM    Michele Fischeravid Mariza Bourget, MD ; [facility] Mobile  [phone].  After 5:30 PM or weekends, call [phone]

## 2014-06-23 NOTE — Progress Notes (Signed)
Admit Date: 06/21/2014 Subjective: Pt unable to participate in ROS 2/2 encephalopathy.  Objective: Vital signs in last 24 hours:    Tmax:   99.57F  HR:   70-94     RR:   14-23  BP:   118/70-170/80  Pulse Ox:  97-100% on Vent: AC/VC 15/Vt 400/FIO2 30%/PEEP 5  LINE/FOLEY:  PICC/FOLEY  HEENT: [place]/AT, PERRL bilaterally, unable to test EOM 2/2 encephalopathy, ETT in place and secure, Sclera anicteric  NECK: Supple, No LAD, JVD Lungs: CTAb, no increase WOB, symmetrical chest rise with vent CV: Normal S1, S2, grade 3/6 SEM best heard at LSB ABD:S/NT/ND +BS X 4. No grimace with deep palpation EXT: no CCE bilaterally, pulses 2+ NEURO: unable to test CN 2/2 encephalopathy, appears sedated despite no continuous sedation since Day#1 of admit Skin: Warm, dry and intact  Lab results reviewed by physician.  Recent Labs  06/21/14 1539  WBC 6.7  HGB 9.2*  HCT 26.4*  NA 139  K 3.9  CL 104  CO2 27  BUN 10  CREATININE 0.61   Liver Panel  Recent Labs  06/21/14 1539  PROT 5.7*  ALBUMIN 2.3*  AST 27  ALT 19  ALKPHOS 71  BILITOT <0.2*   Sedimentation Rate No results found for this basename: ESRSEDRATE,  in the last 72 hours C-Reactive Protein No results found for this basename: CRP,  in the last 72 hours  Microbiology: No results found for this or any previous visit (from the past 240 hour(s)).  Studies/Results: Ct Head Wo Contrast  06/22/2014   CLINICAL DATA:  73 year old female with altered mental status. Unresponsive patient.  EXAM: CT HEAD WITHOUT CONTRAST  TECHNIQUE: Contiguous axial images were obtained from the base of the skull through the vertex without intravenous contrast.  COMPARISON:  Multiple priors, most recently 06/16/2014.  FINDINGS: Again noted is severe cerebral and cerebellar atrophy with some ex vacuo dilatation of the ventricular system. However, there is also rounding of the temporal horns, rounding of the third ventricle and dilatation of the fourth  ventricle, all of which appears chronic compared to prior studies, and are suggestive of underlying chronic communicating hydrocephalus. Patchy and confluent areas of decreased attenuation are noted throughout the deep and periventricular white matter of the cerebral hemispheres bilaterally, compatible with chronic microvascular ischemic disease. No acute intracranial abnormalities. Specifically, no evidence of acute/subacute ischemia, no acute intracranial hemorrhage, no mass, mass effect or other abnormal intra or extra-axial fluid collections. No acute displaced skull fractures are identified. Mastoids are well pneumatized bilaterally. Multifocal mucosal thickening in the ethmoid sinuses bilaterally, frontal sinuses bilaterally, and some opacification of the left frontal sinus and frontoethmoidal recess, similar to the prior examination. 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  IMPRESSION: 1. No acute findings. 2. 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 3. Paranasal sinus disease, as above, similar to prior examination.   Electronically Signed   By: Trudie Reedaniel  Entrikin M.D.   On: 06/22/2014 13:19   Dg Chest Port 1 View  06/21/2014   CLINICAL DATA:  Respiratory failure. Hypertension. Diabetes. Dementia.  EXAM: PORTABLE CHEST - 1 VIEW  COMPARISON:  06/19/2014  FINDINGS: The patient is rotated to the Left on today's radiograph, reducing diagnostic sensitivity and specificity. Endotracheal tube is 10 mm above the carina. Consider retracting 1.5 cm.  Continued left basilar airspace opacity with obscuration of  left hemidiaphragm. Heart size within normal limits.  Right central line tip: Lower SVC. A nasogastric tube enters the stomach.  IMPRESSION: 1. Stable appearance is chest. The endotracheal tube is only 1 cm above  the carina, consider retracting 1.5 cm. 2. Stable left lower lobe airspace opacity, nonspecific, potentially from atelectasis, layering pleural effusion, or pneumonia.   Electronically Signed   By: Herbie Baltimore M.D.   On: 06/21/2014 16:42   Dg Abd Portable 1v  06/21/2014   CLINICAL DATA:  Orogastric tube placement.  Hypertension.  Diabetes.  EXAM: PORTABLE ABDOMEN - 1 VIEW  COMPARISON:  04/27/2014  FINDINGS: Orogastric tube is in the stomach with tip in the stomach body. The stomach appears somewhat distended given the curvature of the tube.  Retrocardiac airspace opacity, retrocardiac airspace opacity.  IMPRESSION: 1. Orogastric tube tip is in the mildly distended stomach body. 2. Left lower lobe airspace opacity.   Electronically Signed   By: Herbie Baltimore M.D.   On: 06/21/2014 16:43    Medications: I have reviewed the patient's current medications.  Assessment/Plan:  VDRF/Aspiration Pneumonia/HCAP (LLL):  E.coli, extended spectrum-lactamase UTI: -continue vent weaning protocol  -taper steroids to off; prednisone will complete 10/13  -Fentanyl IV q 2h prn for agitation on the vent  -cont VAP prevention protocol  -pulm following  -prn pCXR, ABG and BD; lab survey weekly  -hold PT/OT eval and tx for now  -continue abx (Meropenem to complete 10/14)   Severe Vascular Dementia/Acute Toxic Encephalopathy/Anoxic Brain Injury:  -d/w son that we are unsure of how much recovery of brain function pt will have, time will tell; he stated that in the past pt has taken a "long time" to recover from sedatives  -mental status serves as a barrier to extubation given good air exchange mechanics  -limitation to successful extubation -neurochecks q shift -attempt to avoid sedatives -d/w son, Macky Lower, today via phone [phone] above; he was not aware of anoxic brain injury dx from status epilepticus PTA but understands, and accepts its barriers  Hypomagnesium: -replete to achieve goal  of 2.0, continue mag replacment protocol -check until normalized with each lab draw  Seizure disorder:  -check keppra level, result pending  -continue anticonvulsants: Keppra and Vimpat; prn Ativan IV if breakthrough seizure and order to notify physician  -continue seizure and aspiration precautions  -no seizures noted this admission  -CT of Head w/o contrast 10/8: negative for acute pathology  Hyperthyroidism, contributor of Tachycardia:  -H/o Hypothyroidism (previously was on Synthroid per son) -checked TFT panel, normal Free T3 and depressed TSH; Free T3 confounded likely by steroids and infection; continue to monitor need of methimazole -increase methimazole to 15mg  q 8h as pt has not achieved a euthyroid state in looking at PTA records and our stay and continues to exhibit signs of hyperthyroid state, recheck TFTs in 2 weeks -cont coreg 25mg  PO BID to assist with tachycardia, malignant HTN and CAD  -may be able to d/c methimazole at next panel recheck   Malignant HTN:  -titrate anti-hypertensive agents to goal and tolerance  -long standing problem per son   DMII, uncontrolled: (hbg A1c 6.8)  -continue low dose Novolog SSI  -continue Plavix, Statin, and add ARB (unsure of ACE-I tolerance) for renal protection  -may benefit from low dose basal insulin, monitor BG curve after steroids are discontinued   Vitamin D Def:  -ergocalciferol 50K IU q weekly, recheck level in 4 weeks and adjust frequency accordingly   CDIFF, refractory:  -  Day #3/10 Vanc 125mg  PO q 6h x 10 days and Flagyl 500mg  IV q 8h  -assess for fecal tube removal each shift   Acute on chronic pansinusitis:  -abx as noted above and cont Flonase daily   Anemia of chronic disease and iron def:  -checked iron panel, recommend tx as below and recheck panel in 3 months and adjust tx accordingly  -ferrous sulfate 325mg  PO BID + vitamin C 500mg  PO BID  -monitor CBC weekly given lovenox for VTE, more often in clinically  indicated    Vitamin B12 Def: -son states pt has been taking PO Vit B12 daily for awhile; check level, if depressed will d/c PO and start SQ injections  PEM, severe/Generalized Weakness:  -RD following, Vital 1.5 TF tra 3250ml/h + 50ml FWF q 2h and vitamin supplements  -ST tx plan when mentation improves  -PT/OT eval and tx on hold given severe encephalopathy and VDRF, reassess weekly for participation ability   GI proph: probiotic/pepcid  VTE proph: lovenox 40mg  [place] daily  Code Status: Full code   Family Communication: spoke to MPOA/Son [name] via phone as [place] lives in [place] for about 45 minutes. Discussed PTA events and functioning level, treatment plan and prognosis is full detail. All questions answered and MPOA in agreement with treatment plan.  Critical Care Time: 35 minutes  Howis Y Toler 06/23/2014, 4:04 PM

## 2014-06-24 LAB — MAGNESIUM
MAGNESIUM: 2.1 mg/dL (ref 1.5–2.5)
Magnesium: 1.7 mg/dL (ref 1.5–2.5)

## 2014-06-24 LAB — BASIC METABOLIC PANEL
Anion gap: 9 (ref 5–15)
Anion gap: 10 (ref 5–15)
BUN: 15 mg/dL (ref 6–23)
BUN: 16 mg/dL (ref 6–23)
CALCIUM: 9.1 mg/dL (ref 8.4–10.5)
CHLORIDE: 96 meq/L (ref 96–112)
CO2: 32 meq/L (ref 19–32)
CO2: 30 meq/L (ref 19–32)
CREATININE: 0.63 mg/dL (ref 0.50–1.10)
Calcium: 8.9 mg/dL (ref 8.4–10.5)
Chloride: 99 mEq/L (ref 96–112)
Creatinine, Ser: 0.61 mg/dL (ref 0.50–1.10)
GFR calc Af Amer: >90 mL/min (ref >90)
GFR calc Af Amer: >90 mL/min (ref >90)
GFR calc non Af Amer: 86 mL/min — ABNORMAL LOW (ref >90)
GFR calc non Af Amer: 87 mL/min — ABNORMAL LOW (ref >90)
GLUCOSE: 135 mg/dL — AB (ref 70–99)
Glucose, Bld: 336 mg/dL — ABNORMAL HIGH (ref 70–99)
POTASSIUM: 3.9 meq/L (ref 3.7–5.3)
Potassium: 3.1 mEq/L — ABNORMAL LOW (ref 3.7–5.3)
Sodium: 140 mEq/L (ref 137–147)
Sodium: 136 mEq/L — ABNORMAL LOW (ref 137–147)

## 2014-06-24 LAB — CBC
HEMATOCRIT: 25.7 % — AB (ref 36.0–46.0)
HEMOGLOBIN: 8.8 g/dL — AB (ref 12.0–15.0)
MCH: 23.8 pg — ABNORMAL LOW (ref 26.0–34.0)
MCHC: 34.2 g/dL (ref 30.0–36.0)
MCV: 69.6 fL — AB (ref 78.0–100.0)
Platelets: 374 10*3/uL (ref 150–400)
RBC: 3.69 MIL/uL — ABNORMAL LOW (ref 3.87–5.11)
RDW: 15.9 % — ABNORMAL HIGH (ref 11.5–15.5)
WBC: 7.7 10*3/uL (ref 4.0–10.5)

## 2014-06-24 LAB — VITAMIN B12: Vitamin B-12: 1486 pg/mL — ABNORMAL HIGH (ref 211–911)

## 2014-06-24 LAB — PHOSPHORUS: Phosphorus: 2.2 mg/dL — ABNORMAL LOW (ref 2.3–4.6)

## 2014-06-25 LAB — BASIC METABOLIC PANEL
ANION GAP: 7 (ref 5–15)
ANION GAP: 8 (ref 5–15)
BUN: 14 mg/dL (ref 6–23)
BUN: 14 mg/dL (ref 6–23)
CALCIUM: 8.8 mg/dL (ref 8.4–10.5)
CO2: 33 mEq/L — ABNORMAL HIGH (ref 19–32)
CO2: 32 meq/L (ref 19–32)
Calcium: 8.8 mg/dL (ref 8.4–10.5)
Chloride: 96 mEq/L (ref 96–112)
Chloride: 97 mEq/L (ref 96–112)
Creatinine, Ser: 0.62 mg/dL (ref 0.50–1.10)
Creatinine, Ser: 0.65 mg/dL (ref 0.50–1.10)
GFR calc Af Amer: >90 mL/min (ref >90)
GFR calc Af Amer: >90 mL/min (ref >90)
GFR calc non Af Amer: 85 mL/min — ABNORMAL LOW (ref >90)
GFR calc non Af Amer: 86 mL/min — ABNORMAL LOW (ref >90)
GLUCOSE: 286 mg/dL — AB (ref 70–99)
Glucose, Bld: 299 mg/dL — ABNORMAL HIGH (ref 70–99)
POTASSIUM: 3.9 meq/L (ref 3.7–5.3)
Potassium: 3.5 mEq/L — ABNORMAL LOW (ref 3.7–5.3)
SODIUM: 136 meq/L — AB (ref 137–147)
Sodium: 137 mEq/L (ref 137–147)

## 2014-06-25 LAB — CBC
HCT: 24.2 % — ABNORMAL LOW (ref 36.0–46.0)
Hemoglobin: 8.4 g/dL — ABNORMAL LOW (ref 12.0–15.0)
MCH: 24.3 pg — ABNORMAL LOW (ref 26.0–34.0)
MCHC: 34.7 g/dL (ref 30.0–36.0)
MCV: 69.9 fL — AB (ref 78.0–100.0)
Platelets: 365 10*3/uL (ref 150–400)
RBC: 3.46 MIL/uL — AB (ref 3.87–5.11)
RDW: 16.3 % — ABNORMAL HIGH (ref 11.5–15.5)
WBC: 7.1 10*3/uL (ref 4.0–10.5)

## 2014-06-25 LAB — MAGNESIUM: Magnesium: 1.9 mg/dL (ref 1.5–2.5)

## 2014-06-25 LAB — PHOSPHORUS: Phosphorus: 2.1 mg/dL — ABNORMAL LOW (ref 2.3–4.6)

## 2014-06-25 LAB — LEVETIRACETAM LEVEL: Levetiracetam Lvl: 34 ug/mL

## 2014-06-26 DIAGNOSIS — J9600 Acute respiratory failure, unspecified whether with hypoxia or hypercapnia: Secondary | ICD-10-CM | POA: Diagnosis not present

## 2014-06-26 DIAGNOSIS — I639 Cerebral infarction, unspecified: Secondary | ICD-10-CM | POA: Diagnosis not present

## 2014-06-26 DIAGNOSIS — J9601 Acute respiratory failure with hypoxia: Secondary | ICD-10-CM | POA: Diagnosis not present

## 2014-06-26 LAB — HEMOGLOBIN AND HEMATOCRIT, BLOOD
HEMATOCRIT: 25.2 % — AB (ref 36.0–46.0)
HEMOGLOBIN: 8.7 g/dL — AB (ref 12.0–15.0)

## 2014-06-26 LAB — MAGNESIUM: Magnesium: 1.6 mg/dL (ref 1.5–2.5)

## 2014-06-26 LAB — BASIC METABOLIC PANEL
ANION GAP: 9 (ref 5–15)
BUN: 10 mg/dL (ref 6–23)
CALCIUM: 8 mg/dL — AB (ref 8.4–10.5)
CO2: 30 mEq/L (ref 19–32)
Chloride: 99 mEq/L (ref 96–112)
Creatinine, Ser: 0.59 mg/dL (ref 0.50–1.10)
GFR calc Af Amer: >90 mL/min (ref >90)
GFR calc non Af Amer: 87 mL/min — ABNORMAL LOW (ref >90)
GLUCOSE: 310 mg/dL — AB (ref 70–99)
POTASSIUM: 3.5 meq/L — AB (ref 3.7–5.3)
SODIUM: 138 meq/L (ref 137–147)

## 2014-06-26 LAB — PHOSPHORUS: PHOSPHORUS: 3.3 mg/dL (ref 2.3–4.6)

## 2014-06-26 NOTE — Progress Notes (Signed)
Name: Michele Guerra    ADMISSION DATE:  06/21/2014 CONSULTATION DATE:  06/21/2014  REFERRING MD :  Dr. Sharyon MedicusHijazi  CHIEF COMPLAINT:  VDRF  BRIEF PATIENT DESCRIPTION: 73 year old female admitted [facility] and intubated for status epilepticus. She failed extubation twice while there, and [facility]. PCCM contacted for ventilator assistance.   SIGNIFICANT EVENTS  9/29 - [facility]> intubated 10/2 - failed extubation 10/5 - began following commands, failed extubation.  10[facility]7 - [facility]8: vent mechanics look good but still not awake enough to protect airway.  10/9 apneic during waning trials. No sedation over night. 10/12 currently on full vent support. STUDIES:  10/2 CT head > no acute intracranial abnormality, chronic vascular changes and atrophy.   LINES/TUBES OETT 9/29 >>> PICC 10/2 >>>   INTERVAL: Failed weaning 10/12   VITAL SIGNS: Vital signs reviewed. Abnormal values will appear under impression plan section.     PHYSICAL EXAMINATION: General:  Female of normal body habitus in NAD on ventilator Neuro:  , RASS -3 despite no sedation. , grimaces to pain HEENT:  [name]/AT, ETT in place, No JVD noted, left periorbital; edema  Cardiovascular:  RRR, SEM 2/6 Lungs:  Clear anterior breath sounds. Unlabored, synchronous with vent. On full support Abdomen:  Soft, non-distended, BS normoactive. TF running Musculoskeletal:  No acute deformity.  Skin:  Intact   Recent Labs Lab 06/24/14 1847 06/25/14 0500 06/26/14 0500  NA 136* 137 138  K 3.9 3.5* 3.5*  CL 96 97 99  CO2 30 33* 30  BUN 16 14 10   CREATININE 0.61 0.65 0.59  GLUCOSE 336* 286* 310*    Recent Labs Lab 06/21/14 1539 06/24/14 0500 06/25/14 0500 06/26/14 0500  HGB 9.2* 8.8* 8.4* 8.7*  HCT 26.4* 25.7* 24.2* 25.2*  WBC 6.7 7.7 7.1  --   PLT 352 374 365  --    ABG    Component Value Date/Time   PHART 7.452*  06/22/2014 1055   PCO2ART 38.4 06/22/2014 1055   PO2ART 119.0* 06/22/2014 1055   HCO3 26.5* 06/22/2014 1055   TCO2 27.6 06/22/2014 1055   O2SAT 98.9 06/22/2014 1055    No results found.PCXR Left base volume loss  ASSESSMENT / PLAN:  Acute Respiratory Failure s/p seizures HCAP vs aspiration  Severe dementia Anoxic brain injury HTN  DM  Cdiff colitis    Discussion  Gas exchange looks good. Mechanics are favorable but MS prevents extubation at this point.  Carries dx of anoxic brain injury. ? Need for trach  recs - [place] for extubation  If/when MS allows, Failed weaning due to apnea even off sedation  Intubated 9/29 now #14/x of OTT - VAP prevention per protocol - Abx per primary service  - PRN albuterol - Taper prednisone to off - Possible trach in future pending family meeting [facility].  Brett CanalesSteve Minor ACNP [facility] Pager [phone] till 3 pm If no answer page [phone] 06/26/2014, 9:57 AM  Patient seen and examined, above note edited in full.  Spoke with Dr. Audley HoseKoller, will communicate with family regarding tracheostomy and pending that discussion will determine if family wishes for trach (patient is had anoxic injury and has history of dementia) would not recommend tracheostomy due to futility.  Patient seen and examined, agree with above note.  I dictated the care and orders written for this patient under my direction.  Alyson ReedyWesam G Yacoub, MD [phone]

## 2014-06-26 NOTE — Progress Notes (Signed)
Admit Date: 06/21/2014 Subjective: Pt unable to participate in ROS 2/2 encephalopathy.  Objective: Vital signs in last 24 hours:    Tmax:   98.74F  HR:   76-100     RR:   14-23  BP:   118/70-170/80  Pulse Ox:  97-100% on Vent: AC/VC 15/Vt 400/FIO2 30%/PEEP 5  BG:  285-320 I/O:  2050/2000 LINE/FOLEY:  PICC/FOLEY/RECTAL TUBE  HEENT: [place]/AT, PERRL bilaterally, unable to test EOM 2/2 encephalopathy, ETT in place and secure, Sclera anicteric  NECK: Supple, No LAD, JVD Lungs: CTAb, no increase WOB, symmetrical chest rise with vent CV: Normal S1, S2, grade 3/6 SEM best heard at LSB ABD:S/NT/ND +BS X 4. No grimace with deep palpation EXT: no CCE bilaterally, pulses 2+ NEURO: unable to test CN 2/2 encephalopathy, appears sedated despite no continuous sedation since Day#1 of admit Skin: Warm, dry and intact  Lab results reviewed by physician.  Recent Labs  06/24/14 0500 06/24/14 1847 06/25/14 0500  WBC 7.7  --  7.1  HGB 8.8*  --  8.4*  HCT 25.7*  --  24.2*  NA 140 136* 137  K 3.1* 3.9 3.5*  CL 99 96 97  CO2 32 30 33*  BUN 15 16 14   CREATININE 0.63 0.61 0.65   Liver Panel No results found for this basename: PROT, ALBUMIN, AST, ALT, ALKPHOS, BILITOT, BILIDIR, IBILI,  in the last 72 hours Sedimentation Rate No results found for this basename: ESRSEDRATE,  in the last 72 hours C-Reactive Protein No results found for this basename: CRP,  in the last 72 hours  Microbiology: No results found for this or any previous visit (from the past 240 hour(s)).  Studies/Results: No results found.  Medications: I have reviewed the patient's current medications.  Assessment/Plan: Continue with current treatment plan as noted below.   VDRF/Aspiration Pneumonia/HCAP (LLL):  E.coli, extended spectrum-lactamase UTI:  -continue vent weaning protocol  -taper steroids to off; prednisone will complete 10/13  -Fentanyl 50mcg IV q 2h prn for agitation on the vent  -cont VAP prevention protocol   -pulm following  -prn pCXR, ABG and BD; lab survey weekly  -hold PT/OT eval and tx for now  -continue abx (Meropenem to complete 10/14)   Severe Vascular Dementia/Acute Toxic Encephalopathy/Anoxic Brain Injury:  -mental status serves as a barrier to extubation given good air exchange mechanics  -limitation to successful extubation  -neurochecks q shift  -attempt to avoid sedatives   Hypomagnesium/Hypokalemia/Hypophosphotemia:  -replete and continue replacement protocols if applicable  -check until normalized with each lab draw  -continue telemetry monitoring   Seizure disorder:  -check keppra level, result pending  -continue anticonvulsants: Keppra and Vimpat; prn Ativan IV if breakthrough seizure and order to notify physician  -continue seizure and aspiration precautions  -no seizures noted this admission  -CT of Head w/o contrast 10/8: negative for acute pathology   Malignant HTN:  -titrate anti-hypertensive agents to goal and tolerance   DMII, uncontrolled: (hbg A1c 6.8)  -continue low dose Novolog SSI  -continue Plavix, Statin, and add ARB (unsure of ACE-I tolerance) for renal protection  -may benefit from low dose basal insulin, monitor BG curve after steroids are discontinued   Vitamin D Def:  -ergocalciferol 50K IU q weekly, recheck level in 4 weeks and adjust frequency accordingly   CDIFF, refractory:  -continue Vanc 125mg  PO q 6h x 10 days and Flagyl 500mg  IV q 8h  -assess for fecal tube removal each shift   Acute on chronic pansinusitis:  -abx  as noted above and cont Flonase daily   Anemia of chronic disease and iron def:  -checked iron panel, recommend tx as below and recheck panel in 3 months and adjust tx accordingly  -ferrous sulfate 325mg  PO BID + vitamin C 500mg  PO BID  -monitor CBC weekly given lovenox for VTE, more often in clinically indicated   PEM, severe/Generalized Weakness:  -RD following, Vital 1.5 TF tra 3250ml/h + 50ml FWF q 2h and vitamin  supplements  -ST tx plan when mentation improves  -PT/OT eval and tx on hold given severe encephalopathy and VDRF, reassess weekly for participation ability   GI proph: probiotic/pepcid  VTE proph: lovenox 40mg  [place] daily  Code Status: Full code   Critical Care Time: 35 minutes  TOLER,HOWIS  06/25/2014 at 5:01 PM

## 2014-06-26 NOTE — Progress Notes (Signed)
Admit Date: 06/21/2014 Subjective: Pt unable to participate in ROS 2/2 encephalopathy.  Objective: Vital signs in last 24 hours:    Tmax:   99.53F  HR:   78-92     RR:   16-20  BP:   144/69-187/87 Pulse Ox:  98-100% on Vent: AC/VC 15/Vt 400/FIO2 30%/PEEP 5  I/O:  3300/3250 LINE/FOLEY:  PICC/FOLEY/RECTAL TUBE  HEENT: [place]/AT, PERRL bilaterally, unable to test EOM 2/2 encephalopathy, ETT in place and secure, Sclera anicteric  NECK: Supple, No LAD, JVD Lungs: CTAb, no increase WOB, symmetrical chest rise with vent CV: Normal S1, S2, grade 3/6 SEM best heard at LSB ABD:S/NT/ND +BS X 4. No grimace with deep palpation EXT: no CCE bilaterally, pulses 2+ NEURO: unable to test CN 2/2 encephalopathy, appears sedated despite no continuous sedation  Skin: Warm, dry and intact  Lab results reviewed by physician.  Liver Panel No results found for this basename: PROT, ALBUMIN, AST, ALT, ALKPHOS, BILITOT, BILIDIR, IBILI,  in the last 72 hours Sedimentation Rate No results found for this basename: ESRSEDRATE,  in the last 72 hours C-Reactive Protein No results found for this basename: CRP,  in the last 72 hours  Microbiology: No results found for this or any previous visit (from the past 240 hour(s)).  Studies/Results: No results found.  Medications: I have reviewed the patient's current medications.  Assessment/Plan: Continue with current treatment plan as noted below.  VDRF/Aspiration Pneumonia/HCAP (LLL):  E.coli, extended spectrum-lactamase UTI: -continue vent weaning protocol  -taper steroids to off; prednisone will complete 10/13  -Fentanyl 50mcg IV q 2h prn for agitation on the vent  -cont VAP prevention protocol  -pulm following  -prn pCXR, ABG and BD; lab survey weekly  -hold PT/OT eval and tx for now  -continue abx (Meropenem to complete 10/14)   Severe Vascular Dementia/Acute Toxic Encephalopathy/Anoxic Brain Injury:  -mental status serves as a barrier to extubation  given good air exchange mechanics  -limitation to successful extubation -neurochecks q shift -attempt to avoid sedatives  Hypomagnesium/Hypokalemia/Hypophosphotemia: -replete and continue replacement protocols if applicable  -check until normalized with each lab draw -continue telemetry monitoring  Seizure disorder:  -check keppra level, result pending  -continue anticonvulsants: Keppra and Vimpat; prn Ativan IV if breakthrough seizure and order to notify physician  -continue seizure and aspiration precautions  -no seizures noted this admission  -CT of Head w/o contrast 10/8: negative for acute pathology  Malignant HTN:  -titrate anti-hypertensive agents to goal and tolerance  -long standing problem per son   DMII, uncontrolled: (hbg A1c 6.8)  -continue low dose Novolog SSI  -continue Plavix, Statin, and add ARB (unsure of ACE-I tolerance) for renal protection  -may benefit from low dose basal insulin, monitor BG curve after steroids are discontinued   Vitamin D Def:  -ergocalciferol 50K IU q weekly, recheck level in 4 weeks and adjust frequency accordingly   CDIFF, refractory:  -continue Vanc 125mg  PO q 6h x 10 days and Flagyl 500mg  IV q 8h  -assess for fecal tube removal each shift   Acute on chronic pansinusitis:  -abx as noted above and cont Flonase daily   Anemia of chronic disease and iron def:  -checked iron panel, recommend tx as below and recheck panel in 3 months and adjust tx accordingly  -ferrous sulfate 325mg  PO BID + vitamin C 500mg  PO BID  -monitor CBC weekly given lovenox for VTE, more often in clinically indicated    PEM, severe/Generalized Weakness:  -RD following, Vital 1.5 TF tra  3950ml/h + 50ml FWF q 2h and vitamin supplements  -ST tx plan when mentation improves  -PT/OT eval and tx on hold given severe encephalopathy and VDRF, reassess weekly for participation ability   GI proph: probiotic/pepcid  VTE proph: lovenox 40mg  [place] daily  Code Status: Full  code   Critical Care Time: 35 minutes  Guerra,Michele Blomquist 06/24/2014 at 3:25 PM

## 2014-06-27 ENCOUNTER — Other Ambulatory Visit (HOSPITAL_COMMUNITY): Payer: Medicaid Other

## 2014-06-27 DIAGNOSIS — J9600 Acute respiratory failure, unspecified whether with hypoxia or hypercapnia: Secondary | ICD-10-CM | POA: Diagnosis not present

## 2014-06-27 DIAGNOSIS — I639 Cerebral infarction, unspecified: Secondary | ICD-10-CM | POA: Diagnosis not present

## 2014-06-27 DIAGNOSIS — J9601 Acute respiratory failure with hypoxia: Secondary | ICD-10-CM | POA: Diagnosis not present

## 2014-06-27 LAB — BASIC METABOLIC PANEL
Anion gap: 10 (ref 5–15)
BUN: 11 mg/dL (ref 6–23)
CALCIUM: 8.8 mg/dL (ref 8.4–10.5)
CO2: 29 meq/L (ref 19–32)
CREATININE: 0.67 mg/dL (ref 0.50–1.10)
Chloride: 95 mEq/L — ABNORMAL LOW (ref 96–112)
GFR calc Af Amer: >90 mL/min (ref >90)
GFR, EST NON AFRICAN AMERICAN: 84 mL/min — AB (ref >90)
GLUCOSE: 282 mg/dL — AB (ref 70–99)
Potassium: 4.9 mEq/L (ref 3.7–5.3)
SODIUM: 134 meq/L — AB (ref 137–147)

## 2014-06-27 LAB — PHOSPHORUS: Phosphorus: 2.6 mg/dL (ref 2.3–4.6)

## 2014-06-27 LAB — HEMOGLOBIN AND HEMATOCRIT, BLOOD
HCT: 26.5 % — ABNORMAL LOW (ref 36.0–46.0)
HEMOGLOBIN: 9.1 g/dL — AB (ref 12.0–15.0)

## 2014-06-27 LAB — MAGNESIUM: Magnesium: 2 mg/dL (ref 1.5–2.5)

## 2014-06-27 NOTE — Progress Notes (Signed)
Admit Date: 06/21/2014   Subjective:  Pt unable to participate in ROS 2/2 encephalopathy.  Objective: Vital signs in last 24 hours: Tmax:   99.51F  HR:   70-88 RR:  16-18  BP:   132/62-167/92 Pulse Ox:  98-100% on Vent: AC/VC 15/400/28/5 I/O:  2257/2500 BG:  332-354 LINE/FOLEY:  PICC/FOLEY/Rectal Tube  Physical Exam:  HEENT: [place]/AT, PERRL bilaterally, unable to test EOM 2/2 encephalopathy, ETT in place and secure, Sclera anicteric Periorbital edema and pt is not able to control her head/neck and favors left side  NECK: Supple, No LAD, JVD  Lungs: CTAb, no increase WOB, symmetrical chest rise with vent  CV: Normal S1, S2, grade 3/6 SEM best heard at LSB  ABD:S/NT/ND +BS X 4. No grimace with deep palpation  EXT: No cyanosis or clubbing bilaterally, +facial edema, pulses 2+  NEURO: unable to test CN 2/2 encephalopathy, appears sedated despite no continuous sedation since Day#1 of admit  Skin: Warm, dry and intact   Lab Results  Recent Labs  06/25/14 0500 06/26/14 0500 06/27/14 0522  WBC 7.1  --   --   HGB 8.4* 8.7* 9.1*  HCT 24.2* 25.2* 26.5*  NA 137 138 134*  K 3.5* 3.5* 4.9  CL 97 99 95*  CO2 33* 30 29  BUN 14 10 11   CREATININE 0.65 0.59 0.67   Liver Panel No results found for this basename: PROT, ALBUMIN, AST, ALT, ALKPHOS, BILITOT, BILIDIR, IBILI,  in the last 72 hours Sedimentation Rate No results found for this basename: ESRSEDRATE,  in the last 72 hours C-Reactive Protein No results found for this basename: CRP,  in the last 72 hours  Microbiology: No results found for this or any previous visit (from the past 240 hour(s)).  Studies/Results: No results found.  Medications: I have reviewed the patient's current medications.  Assessment/Plan: continue with treatment plan as noted below  VDRF/Aspiration Pneumonia/HCAP (LLL):  E.coli, extended spectrum-lactamase UTI:  -continue vent weaning protocol; failed weaning trial today and continues to have apnea  even off sedation  -taper steroids to off; prednisone complete today -d/c Fentanyl 50mcg IV q 2h prn for agitation on the vent as pt is completely non-responsive -cont VAP prevention protocol  -pulm following; agreed that pt is not a good candidate for trach given anoxic brain injury and severe disease burden -MPOA struggling with need to make patient comfort care; [place] in [place], so planning trip here and we will schedule a care conference within the next week; otherwise coordinate via phone -prn pCXR, ABG and BD; lab survey weekly until pt is made comfort care -PT/OT tx for passive ROM -continue abx (Meropenem to complete tomorrow)   Severe Vascular Dementia/Acute Toxic Encephalopathy/Anoxic Brain Injury:  -mental status continues to serves as a barrier to extubation given good air exchange mechanics  -limitation to successful extubation  -neurochecks q shift  -attempt to avoid sedatives   Hypomagnesium:  -replete to achieve goal of 2.0, continue mag replacment protocol  -check until normalized with each lab draw   Hypokalemia: resolved -replete to achieve goal of 4.0, continue potassium replacment protocol; normalize magnesium -check until normalized with each lab draw   Hypophosphotemia: resolved -normalize with PO Phos-Nak, IV KPhos if severely depressed and/or telemetry changes   Seizure disorder:  -check keppra level, non-toxic, 34 -continue anticonvulsants: Keppra and Vimpat; prn Ativan IV if breakthrough seizure and order to notify physician  -continue seizure and aspiration precautions  -no seizures noted this admission  -CT of Head  w/o contrast 10/8: negative for acute pathology   Hyperthyroidism, contributor of Tachycardia:  -H/o Hypothyroidism (previously was on Synthroid per son)  -checked TFT panel, normal Free T3 and depressed TSH; Free T3 confounded likely by steroids and infection; continue to monitor need of methimazole  -cont methimazole to 15mg  q 8h  as pt has not achieved a euthyroid state in looking at PTA records and our stay and continues to exhibit signs of hyperthyroid state, recheck TFTs in 2 weeks  -cont coreg 25mg  PO BID to assist with tachycardia, malignant HTN and CAD  -may be able to d/c methimazole at next panel recheck   Tachycardia: -resolved  Malignant HTN: improved -titrate anti-hypertensive agents to goal and tolerance -(Coreg, Hydralazine, Cozaar)  DMII, uncontrolled: (hbg A1c 6.8)  -continue low dose Novolog SSI; added Levemir 10U BID given BG curve>200, titrate  -continue Plavix, Statin, and add ARB (unsure of ACE-I tolerance) for renal protection  -may benefit from low dose basal insulin, monitor BG curve after steroids are discontinued   CDIFF, refractory: continued decrease in stool outpt today -cont Vanc 125mg  PO q 6h and Flagyl 500mg  IV q 8h x 10 days  -assess for fecal tube removal each shift   Acute on chronic pansinusitis:  -abx as noted above and cont Flonase daily   Anemia of chronic disease and iron def:  -checked iron panel, recommend tx as below and recheck panel in 3 months and adjust tx accordingly  -ferrous sulfate 325mg  PO BID + vitamin C 500mg  PO BID  -monitor CBC weekly given lovenox for VTE, more often in clinically indicated  -H/H depressed but stable and no s/s of active bleeding  PEM, severe/Generalized Weakness:  -RD following, Vital 1.5 TF tra 7050ml/h + 50ml FWF q 2h and vitamin supplements  -ST tx plan when mentation improves  -PT/OT eval and tx on hold given severe encephalopathy and VDRF, reassess weekly for participation ability   GI proph: probiotic/pepcid  VTE proph: lovenox 40mg  [place] daily  Code Status: Full code   Family Communication:  Attempted to call son with update, left message, will try again tomorrow.  Prognosis: Grave  TOLER,HOWIS 06/27/2014 at 4:54 PM

## 2014-06-27 NOTE — Progress Notes (Signed)
Name: Michele Guerra MRN: [id number] DOB: 02/05/1941    ADMISSION DATE:  06/21/2014 CONSULTATION DATE:  06/21/2014  REFERRING MD :  Dr. Sharyon MedicusHijazi  CHIEF COMPLAINT:  VDRF  BRIEF PATIENT DESCRIPTION: 73 year old [facility] and intubated for status epilepticus. She failed extubation twice while there, and [facility] PCCM contacted for ventilator assistance.   SIGNIFICANT EVENTS  [facility] intubated 10/2 - failed extubation 10/5 - began following commands, failed extubation.  [facility] vent mechanics look good but still not awake enough to protect airway.  10/9 apneic during waning trials. No sedation over night. 10/12 currently on full vent support. 10/13 family does not want trach.  STUDIES:  10/2 CT head > no acute intracranial abnormality, chronic vascular changes and atrophy.   LINES/TUBES OETT 9/29 >>> PICC 10/2 >>>   INTERVAL: Failed weaning 10/13   VITAL SIGNS: Vital signs reviewed. Abnormal values will appear under impression plan section.     PHYSICAL EXAMINATION: General:  Female of normal body habitus in NAD on ventilator Neuro:  , RASS -3 despite no sedation. , grimaces to pain, no follows commands HEENT:  [place]/AT, ETT in place, No JVD noted, left periorbital; edema  Cardiovascular:  RRR, SEM 2/6 Lungs:  Clear anterior breath sounds. Unlabored, synchronous with vent. On full support Abdomen:  Soft, non-distended, BS normoactive. TF running Musculoskeletal:  No acute deformity.  Skin:  Intact   Recent Labs Lab 06/25/14 0500 06/26/14 0500 06/27/14 0522  NA 137 138 134*  K 3.5* 3.5* 4.9  CL 97 99 95*  CO2 33* 30 29  BUN 14 10 11   CREATININE 0.65 0.59 0.67  GLUCOSE 286* 310* 282*    Recent Labs Lab 06/21/14 1539 06/24/14 0500 06/25/14 0500 06/26/14 0500 06/27/14 0522  HGB 9.2* 8.8* 8.4* 8.7* 9.1*  HCT 26.4* 25.7* 24.2* 25.2* 26.5*  WBC 6.7 7.7 7.1  --    --   PLT 352 374 365  --   --    ABG    Component Value Date/Time   PHART 7.452* 06/22/2014 1055   PCO2ART 38.4 06/22/2014 1055   PO2ART 119.0* 06/22/2014 1055   HCO3 26.5* 06/22/2014 1055   TCO2 27.6 06/22/2014 1055   O2SAT 98.9 06/22/2014 1055    No results found.PCXR Left base volume loss  ASSESSMENT / PLAN:  Acute Respiratory Failure s/p seizures HCAP vs aspiration  Severe dementia Anoxic brain injury HTN  DM  Cdiff colitis    Discussion  Gas exchange looks good. Mechanics are favorable but MS prevents extubation at this point.  Carries dx of anoxic brain injury. ? Need for trach but family declines trach option 10/13  recs - Hope for extubation  If/when MS allows, Failed weaning due to apnea even off sedation  Intubated 9/29 now #15/x of OTT - VAP prevention per protocol - Abx per primary service  - PRN albuterol - Taper prednisone to off  [facility] AC[facility] Pager [phone] till 3 pm If no answer page [phone] 06/27/2014, 1:43 PM  [facility] spoke with patient's son, after discussion, decision was made to not place trach/peg but son was not quite ready yet for terminal extubation.  Will continue to follow with you.  Patient seen and examined, agree with above note.  I dictated the care and orders written for this patient under my direction.  Alyson ReedyWesam G. Neils Siracusa, M.D. [facility]. Pager: [phone]. After hours pager: [phone].

## 2014-06-27 NOTE — Progress Notes (Addendum)
Admit Date: 06/21/2014   Subjective:  Pt unable to participate in ROS 2/2 encephalopathy.  Objective: Vital signs in last 24 hours: Tmax:   99.47F  HR:   69-80 RR:  15-18  BP:   120/78-130/86 Pulse Ox:  97-100% on Vent I/O:  +1100 LINE/FOLEY:  PICC/FOLEY/Rectal Tube  Physical Exam:  HEENT: [place]/AT, PERRL bilaterally, unable to test EOM 2/2 encephalopathy, ETT in place and secure, Sclera anicteric Periorbital edema and pt is not able to control her head/neck and favors left side  NECK: Supple, No LAD, JVD  Lungs: CTAb, no increase WOB, symmetrical chest rise with vent  CV: Normal S1, S2, grade 3/6 SEM best heard at LSB  ABD:S/NT/ND +BS X 4. No grimace with deep palpation  EXT: No cyanosis or clubbing bilaterally, +facial edema, pulses 2+  NEURO: unable to test CN 2/2 encephalopathy, appears sedated despite no continuous sedation since Day#1 of admit  Skin: Warm, dry and intact   Lab Results  Recent Labs  06/24/14 0500  06/25/14 0500 06/26/14 0500  WBC 7.7  --  7.1  --   HGB 8.8*  --  8.4* 8.7*  HCT 25.7*  --  24.2* 25.2*  NA 140  < > 137 138  K 3.1*  < > 3.5* 3.5*  CL 99  < > 97 99  CO2 32  < > 33* 30  BUN 15  < > 14 10  CREATININE 0.63  < > 0.65 0.59  < > = values in this interval not displayed. Liver Panel No results found for this basename: PROT, ALBUMIN, AST, ALT, ALKPHOS, BILITOT, BILIDIR, IBILI,  in the last 72 hours Sedimentation Rate No results found for this basename: ESRSEDRATE,  in the last 72 hours C-Reactive Protein No results found for this basename: CRP,  in the last 72 hours  Microbiology: No results found for this or any previous visit (from the past 240 hour(s)).  Studies/Results: No results found.  Medications: I have reviewed the patient's current medications.  Assessment/Plan: continue with treatment plan as noted below  VDRF/Aspiration Pneumonia/HCAP (LLL):  E.coli, extended spectrum-lactamase UTI:  -continue vent weaning protocol;  failed weaning trial today given notable apnea even off sedation  -taper steroids to off; prednisone will complete tomorrow -Fentanyl 50mcg IV q 2h prn for agitation on the vent  -cont VAP prevention protocol  -pulm following; agreed that pt is not a good candidate for trach given anoxic brain injury and severe disease burden, left message with MPOA to discuss -prn pCXR, ABG and BD; lab survey weekly  -hold PT/OT eval and tx for now  -continue abx (Meropenem to complete 10/14)   Severe Vascular Dementia/Acute Toxic Encephalopathy/Anoxic Brain Injury:  -mental status continues to serves as a barrier to extubation given good air exchange mechanics  -limitation to successful extubation  -neurochecks q shift  -attempt to avoid sedatives  -left message with MPOA to discuss ongoing tx plan   Hypomagnesium:  -replete to achieve goal of 2.0, continue mag replacment protocol  -check until normalized with each lab draw   Hypokalemia: -replete to achieve goal of 4.0, continue potassium replacment protocol; normalize magnesium -check until normalized with each lab draw   Hypophosphotemia: -normalize with PO Phos-Nak, IV KPhos if severely depressed and/or telemetry changes   Seizure disorder:  -check keppra level, result pending  -continue anticonvulsants: Keppra and Vimpat; prn Ativan IV if breakthrough seizure and order to notify physician  -continue seizure and aspiration precautions  -no seizures noted this admission  -  CT of Head w/o contrast 10/8: negative for acute pathology   Hyperthyroidism, contributor of Tachycardia:  -H/o Hypothyroidism (previously was on Synthroid per son)  -checked TFT panel, normal Free T3 and depressed TSH; Free T3 confounded likely by steroids and infection; continue to monitor need of methimazole  -cont methimazole to 15mg  q 8h as pt has not achieved a euthyroid state in looking at PTA records and our stay and continues to exhibit signs of hyperthyroid state,  recheck TFTs in 2 weeks  -cont coreg 25mg  PO BID to assist with tachycardia, malignant HTN and CAD  -may be able to d/c methimazole at next panel recheck   Tachycardia: -resolved  Malignant HTN: normalizing -titrate anti-hypertensive agents to goal and tolerance  DMII, uncontrolled: (hbg A1c 6.8)  -continue low dose Novolog SSI; added Levemir 10U BID given BG curve>200, titrate  -continue Plavix, Statin, and add ARB (unsure of ACE-I tolerance) for renal protection  -may benefit from low dose basal insulin, monitor BG curve after steroids are discontinued   CDIFF, refractory: finally seeing decrease stool outpt today -Day #5/10 Vanc 125mg  PO q 6h x 10 days and Flagyl 500mg  IV q 8h  -assess for fecal tube removal each shift   Acute on chronic pansinusitis:  -abx as noted above and cont Flonase daily   Anemia of chronic disease and iron def:  -checked iron panel, recommend tx as below and recheck panel in 3 months and adjust tx accordingly  -ferrous sulfate 325mg  PO BID + vitamin C 500mg  PO BID  -monitor CBC weekly given lovenox for VTE, more often in clinically indicated  -H/H depressed but stable and no s/s of active bleeding  PEM, severe/Generalized Weakness:  -RD following, Vital 1.5 TF tra 6150ml/h + 50ml FWF q 2h and vitamin supplements  -ST tx plan when mentation improves  -PT/OT eval and tx on hold given severe encephalopathy and VDRF, reassess weekly for participation ability   GI proph: probiotic/pepcid  VTE proph: lovenox 40mg  [place] daily  Code Status: Full code   Family Communication:   Critical Care Time: 35 minutes  TOLER,Jemel Ono 06/27/2014, 1:08 AM

## 2014-06-28 ENCOUNTER — Other Ambulatory Visit (HOSPITAL_COMMUNITY): Payer: Medicaid Other

## 2014-06-28 DIAGNOSIS — J9600 Acute respiratory failure, unspecified whether with hypoxia or hypercapnia: Secondary | ICD-10-CM

## 2014-06-28 DIAGNOSIS — I639 Cerebral infarction, unspecified: Secondary | ICD-10-CM

## 2014-06-28 NOTE — Progress Notes (Signed)
Name: Michele Guerra MRN: [id number] DOB: 02/14/1941    ADMISSION DATE:  06/21/2014 CONSULTATION DATE:  06/21/2014  REFERRING MD :  Dr. Sharyon MedicusHijazi  CHIEF COMPLAINT:  VDRF  BRIEF PATIENT DESCRIPTION: [facility] and intubated for status epilepticus. She failed extubation twice while there, and [facility]. PCCM contacted for ventilator assistance.   SIGNIFICANT EVENTS  9/29 - [facility]> intubated 10/2 - failed extubation 10/5 - began following commands, failed extubation.  10[facility]7 [facility]8: vent mechanics look good but still not awake enough to protect airway.  10/9 apneic during waning trials. No sedation over night. 10/12 currently on full vent support. 10/13 family does not want trach. 10/14 still not awake enough to extubate   STUDIES:  10/2 CT head > no acute intracranial abnormality, chronic vascular changes and atrophy.   LINES/TUBES OETT 9/29 >>> PICC 10/2 >>>   INTERVAL: Failed weaning 10/14  VITAL SIGNS: Vital signs reviewed. Abnormal values will appear under impression plan section.     PHYSICAL EXAMINATION: General:  Guerra of normal body habitus in NAD on ventilator Neuro:  , RASS -2 despite no sedation. , grimaces to pain, no follows commands HEENT:  [place]/AT, ETT in place, No JVD noted, left periorbital; edema  Cardiovascular:  RRR, SEM 2/6 Lungs:  Clear anterior breath sounds. Unlabored, synchronous with vent. On full support Abdomen:  Soft, non-distended, BS normoactive. TF running Musculoskeletal:  No acute deformity.  Skin:  Intact   Recent Labs Lab 06/25/14 0500 06/26/14 0500 06/27/14 0522  NA 137 138 134*  K 3.5* 3.5* 4.9  CL 97 99 95*  CO2 33* 30 29  BUN 14 10 11   CREATININE 0.65 0.59 0.67  GLUCOSE 286* 310* 282*    Recent Labs Lab 06/21/14 1539 06/24/14 0500 06/25/14 0500 06/26/14 0500 06/27/14 0522  HGB 9.2* 8.8* 8.4* 8.7* 9.1*  HCT 26.4*  25.7* 24.2* 25.2* 26.5*  WBC 6.7 7.7 7.1  --   --   PLT 352 374 365  --   --    ABG    Component Value Date/Time   PHART 7.452* 06/22/2014 1055   PCO2ART 38.4 06/22/2014 1055   PO2ART 119.0* 06/22/2014 1055   HCO3 26.5* 06/22/2014 1055   TCO2 27.6 06/22/2014 1055   O2SAT 98.9 06/22/2014 1055    Dg Chest Port 1 View  06/28/2014   CLINICAL DATA:  Shortness of breath.  Evaluate for aspiration.  EXAM: PORTABLE CHEST - 1 VIEW  COMPARISON:  06/21/2014; 06/19/2014  FINDINGS: Grossly unchanged cardiac silhouette and mediastinal contours. Interval removal to. Otherwise, stable position of remaining support apparatus. No definite pneumothorax, though note, evaluation of the right lung apex is obscured secondary to the patient's overlying chin. Minimally improved aeration the lungs with persistent bibasilar opacities, left greater than right. Trace left-sided effusion is not excluded. No evidence of edema. No acute osseus abnormalities.  IMPRESSION: 1. Interval removal of enteric tube. Otherwise, stable positioning of remaining support apparatus. No definite pneumothorax. 2. Improved aeration the lungs with persistent bibasilar opacities, left greater than right, atelectasis versus infiltrate. No new focal airspace opacities.   Electronically Signed   By: Simonne ComeJohn  Watts M.D.   On: 06/28/2014 02:38   Dg Abd Portable 1v  06/28/2014   CLINICAL DATA:  Orogastric tube placement.  EXAM: PORTABLE ABDOMEN - 1 VIEW  COMPARISON:  06/21/2014  FINDINGS: Orogastric tube tip at the distal stomach.  Extensive artifact overlapping the abdomen and lower chest. The visualized bowel gas pattern is nonobstructive. Lung bases are grossly clear.  IMPRESSION: Orogastric tube tip at the distal stomach.   Electronically Signed   By: Tiburcio PeaJonathan  Watts M.D.   On: 06/28/2014 05:14  PCXR Left base volume loss  ASSESSMENT / PLAN:  Acute Respiratory Failure s/p seizures HCAP vs aspiration  Severe dementia Anoxic brain injury HTN  DM    Cdiff colitis    Discussion  Gas exchange looks good. Mechanics are favorable but MS prevents extubation at this point.  Carries dx of anoxic brain injury. ? Need for trach but family declines trach option 10/13  recs - Hope for extubation  If/when MS allows, Failed weaning due to apnea even off sedation  Intubated 9/29 now #16/x of OTT.Since trach not an option, consider changing code status to DNI and extubate. She may survive extubation.  - VAP prevention per protocol - Abx per primary service  - PRN albuterol - Taper prednisone to off  Brett CanalesSteve Minor ACNP [facility] Pager [phone] till 3 pm If no answer page [phone] 06/28/2014, 9:40 AM  Patient seen and examined, agree with above note.  I dictated the care and orders written for this patient under my direction.  Alyson ReedyWesam G Shailey Butterbaugh, MD [phone]

## 2014-06-29 DIAGNOSIS — T17998D Other foreign object in respiratory tract, part unspecified causing other injury, subsequent encounter: Secondary | ICD-10-CM

## 2014-06-29 NOTE — Progress Notes (Signed)
Name: Michele Guerra MRN: [id number] DOB: 05/08/1941    ADMISSION DATE:  06/21/2014 CONSULTATION DATE:  06/21/2014  REFERRING MD :  Dr. Sharyon MedicusHijazi  CHIEF COMPLAINT:  VDRF  BRIEF PATIENT DESCRIPTION: 73 [facility] and intubated for status epilepticus. She failed extubation twice while there, and [facility] PCCM contacted for ventilator assistance.   SIGNIFICANT EVENTS  9/29 - [facility]> intubated 10/2 - failed extubation 10/5 - began following commands, failed extubation.  [facility] vent mechanics look good but still not awake enough to protect airway.  10/9 apneic during waning trials. No sedation over night. 10/12 currently on full vent support. 10/13 family does not want trach. 10/14 still not awake enough to extubate 10/15 not awake. No family decision concerning terminal extubation  STUDIES:  10/2 CT head > no acute intracranial abnormality, chronic vascular changes and atrophy.   LINES/TUBES OETT 9/29 >>> PICC 10/2 >>>   INTERVAL: Failed weaning 10/15  VITAL SIGNS: Vital signs reviewed. Abnormal values will appear under impression plan section.     PHYSICAL EXAMINATION: General:  Female of normal body habitus in NAD on ventilator Neuro:  , RASS -2 despite no sedation. , grimaces to pain, no follows commands HEENT:  [place]/AT, ETT in place, No JVD noted, left periorbital; edema  Cardiovascular:  RRR, SEM 2/6 Lungs:  Clear anterior breath sounds. Unlabored, synchronous with vent. On full support, failed ps wean Abdomen:  Soft, non-distended, BS normoactive. TF running Musculoskeletal:  No acute deformity.  Skin:  Intact   Recent Labs Lab 06/25/14 0500 06/26/14 0500 06/27/14 0522  NA 137 138 134*  K 3.5* 3.5* 4.9  CL 97 99 95*  CO2 33* 30 29  BUN 14 10 11   CREATININE 0.65 0.59 0.67  GLUCOSE 286* 310* 282*    Recent Labs Lab 06/24/14 0500 06/25/14 0500  06/26/14 0500 06/27/14 0522  HGB 8.8* 8.4* 8.7* 9.1*  HCT 25.7* 24.2* 25.2* 26.5*  WBC 7.7 7.1  --   --   PLT 374 365  --   --    ABG    Component Value Date/Time   PHART 7.452* 06/22/2014 1055   PCO2ART 38.4 06/22/2014 1055   PO2ART 119.0* 06/22/2014 1055   HCO3 26.5* 06/22/2014 1055   TCO2 27.6 06/22/2014 1055   O2SAT 98.9 06/22/2014 1055    Dg Chest Port 1 View  06/28/2014   CLINICAL DATA:  Shortness of breath.  Evaluate for aspiration.  EXAM: PORTABLE CHEST - 1 VIEW  COMPARISON:  06/21/2014; 06/19/2014  FINDINGS: Grossly unchanged cardiac silhouette and mediastinal contours. Interval removal to. Otherwise, stable position of remaining support apparatus. No definite pneumothorax, though note, evaluation of the right lung apex is obscured secondary to the patient's overlying chin. Minimally improved aeration the lungs with persistent bibasilar opacities, left greater than right. Trace left-sided effusion is not excluded. No evidence of edema. No acute osseus abnormalities.  IMPRESSION: 1. Interval removal of enteric tube. Otherwise, stable positioning of remaining support apparatus. No definite pneumothorax. 2. Improved aeration the lungs with persistent bibasilar opacities, left greater than right, atelectasis versus infiltrate. No new focal airspace opacities.   Electronically Signed   By: Simonne ComeJohn  Watts M.D.   On: 06/28/2014 02:38   Dg Abd Portable 1v  06/28/2014   CLINICAL DATA:  Orogastric tube placement.  EXAM: PORTABLE ABDOMEN - 1 VIEW  COMPARISON:  06/21/2014  FINDINGS: Orogastric tube tip  at the distal stomach.  Extensive artifact overlapping the abdomen and lower chest. The visualized bowel gas pattern is nonobstructive. Lung bases are grossly clear.  IMPRESSION: Orogastric tube tip at the distal stomach.   Electronically Signed   By: Jonathan  Watts M.D.   On: 06/28/2014 05:14  PCXR Left base volume loss  ASTiburcio PeaSESSMENT / PLAN:  Acute Respiratory Failure s/p seizures HCAP vs aspiration   Severe dementia Anoxic brain injury HTN  DM  Cdiff colitis    Discussion  Gas exchange looks good. Mechanics are favorable but MS prevents extubation at this point.  Carries dx of anoxic brain injury. ? Need for trach but family declines trach option 10/13  recs - [place] for extubation  If/when MS allows, Failed weaning due to apnea even off sedation  Intubated 9/29 now #17/x of OTT.Since trach not an option, consider changing code status to DNI and extubate. She may survive extubation.  - VAP prevention per protocol - Abx per primary service  - PRN albuterol - Taper prednisone to off  -No need for PCCM to see daily as we are only waiting for family decision.  Brett CanalesSteve Minor ACNP [facility] P[facility] Pager [phone] till 3 pm If no answer page [phone] 06/29/2014, 9:25 AM  Recommend terminal extubation at this point.    Patient seen and examined, agree with above note.  I dictated the care and orders written for this patient under my direction.  Alyson ReedyWesam G Silvino Selman, MD [phone]

## 2014-06-29 NOTE — Progress Notes (Addendum)
Admit Date: 06/21/2014   Subjective:  Pt unable to participate in ROS 2/2 encephalopathy.  Objective: Vital signs in last 24 hours: Tmax:   99.29F  HR:   77-84 RR:  12-21  BP:   130/80-136/73 Pulse Ox:  98-100% on Vent: AC/VC 15/400/28/5 I/O:  1425/2700 BG:  124-360 LINE/FOLEY:  PICC/FOLEY/Rectal Tube  Physical Exam:  HEENT: [place]/AT, PERRL bilaterally, unable to test EOM 2/2 encephalopathy, ETT in place and secure, Sclera anicteric Periorbital edema and pt is not able to control her head/neck and favors left side  NECK: Supple, No LAD, JVD  Lungs: CTAb, no increase WOB, symmetrical chest rise with vent  CV: Normal S1, S2, grade 3/6 SEM best heard at LSB  ABD:S/NT/ND +BS X 4. No grimace with deep palpation  EXT: No cyanosis or clubbing bilaterally, +facial edema, pulses 2+  NEURO: unable to test CN 2/2 encephalopathy, appears sedated despite no continuous sedation since Day#1 of admit  Skin: Warm, dry and intact   Lab Results  Recent Labs  06/26/14 0500 06/27/14 0522  HGB 8.7* 9.1*  HCT 25.2* 26.5*  NA 138 134*  K 3.5* 4.9  CL 99 95*  CO2 30 29  BUN 10 11  CREATININE 0.59 0.67   Liver Panel No results found for this basename: PROT, ALBUMIN, AST, ALT, ALKPHOS, BILITOT, BILIDIR, IBILI,  in the last 72 hours Sedimentation Rate No results found for this basename: ESRSEDRATE,  in the last 72 hours C-Reactive Protein No results found for this basename: CRP,  in the last 72 hours  Microbiology: No results found for this or any previous visit (from the past 240 hour(s)).  Studies/Results: Dg Chest Port 1 View  06/28/2014   CLINICAL DATA:  Shortness of breath.  Evaluate for aspiration.  EXAM: PORTABLE CHEST - 1 VIEW  COMPARISON:  06/21/2014; 06/19/2014  FINDINGS: Grossly unchanged cardiac silhouette and mediastinal contours. Interval removal to. Otherwise, stable position of remaining support apparatus. No definite pneumothorax, though note, evaluation of the right lung apex  is obscured secondary to the patient's overlying chin. Minimally improved aeration the lungs with persistent bibasilar opacities, left greater than right. Trace left-sided effusion is not excluded. No evidence of edema. No acute osseus abnormalities.  IMPRESSION: 1. Interval removal of enteric tube. Otherwise, stable positioning of remaining support apparatus. No definite pneumothorax. 2. Improved aeration the lungs with persistent bibasilar opacities, left greater than right, atelectasis versus infiltrate. No new focal airspace opacities.   Electronically Signed   By: Simonne ComeJohn  Watts M.D.   On: 06/28/2014 02:38   Dg Abd Portable 1v  06/28/2014   CLINICAL DATA:  Orogastric tube placement.  EXAM: PORTABLE ABDOMEN - 1 VIEW  COMPARISON:  06/21/2014  FINDINGS: Orogastric tube tip at the distal stomach.  Extensive artifact overlapping the abdomen and lower chest. The visualized bowel gas pattern is nonobstructive. Lung bases are grossly clear.  IMPRESSION: Orogastric tube tip at the distal stomach.   Electronically Signed   By: Tiburcio PeaJonathan  Watts M.D.   On: 06/28/2014 05:14    Medications: I have reviewed the patient's current medications.  Assessment/Plan: continue with treatment plan as noted below  VDRF/Aspiration Pneumonia/HCAP (LLL):  E.coli, extended spectrum-lactamase UTI:  -continue vent weaning protocol; failed weaning trial today and continues to have apnea even off sedation  -steroids complete today -cont VAP prevention protocol  -pulm following; agreed that pt is not a good candidate for trach given anoxic brain injury and severe disease burden -MPOA struggling with need to make patient  comfort care or proceed with trach placement as ETT Day #16; Decisions tomorrow -prn pCXR, ABG and BD; lab survey weekly until pt is made comfort care -PT/OT tx for passive ROM -Meropenem complete today  Severe Vascular Dementia/Acute Toxic Encephalopathy/Anoxic Brain Injury:  -mental status continues to serves  as a barrier to extubation given good air exchange mechanics  -limitation to successful extubation  -neurochecks q shift  -attempt to avoid sedatives   Hypomagnesium:  -replete to achieve goal of 2.0, continue mag replacment protocol  -check until normalized with each lab draw   Hypokalemia: resolved -replete to achieve goal of 4.0, continue potassium replacment protocol; normalize magnesium -check until normalized with each lab draw   Hypophosphotemia: resolved -normalize with PO Phos-Nak, IV KPhos if severely depressed and/or telemetry changes   Seizure disorder:  -check keppra level, non-toxic, 34 -continue anticonvulsants: Keppra and Vimpat; prn Ativan IV if breakthrough seizure and order to notify physician  -continue seizure and aspiration precautions  -no seizures noted this admission  -CT of Head w/o contrast 10/8: negative for acute pathology   Hyperthyroidism, contributor of Tachycardia:  -H/o Hypothyroidism (previously was on Synthroid per son)  -checked TFT panel, normal Free T3 and depressed TSH; Free T3 confounded likely by steroids and infection; continue to monitor need of methimazole  -cont methimazole to 15mg  q 8h as pt has not achieved a euthyroid state in looking at PTA records and our stay and continues to exhibit signs of hyperthyroid state, recheck TFTs in 2 weeks  -cont coreg 25mg  PO BID to assist with tachycardia, malignant HTN and CAD  -may be able to d/c methimazole at next panel recheck   Tachycardia: -resolved  Malignant HTN: improved -titrate anti-hypertensive agents to goal and tolerance -(Coreg, Hydralazine, Cozaar)  DMII, uncontrolled: (hbg A1c 6.8)  -continue low dose Novolog SSI; added Levemir 10U BID given BG curve>200, titrate  -continue Plavix, Statin, and add ARB (unsure of ACE-I tolerance) for renal protection  -may benefit from low dose basal insulin, monitor BG curve after steroids are discontinued   CDIFF, refractory: continued  decrease in stool outpt today -cont Vanc 125mg  PO q 6h and Flagyl 500mg  IV q 8h x 10 days  -assess for fecal tube removal each shift   Acute on chronic pansinusitis:  -abx as noted above and cont Flonase daily   Anemia of chronic disease and iron def:  -checked iron panel, recommend tx as below and recheck panel in 3 months and adjust tx accordingly  -ferrous sulfate 325mg  PO BID + vitamin C 500mg  PO BID  -monitor CBC weekly given lovenox for VTE, more often in clinically indicated  -H/H depressed but stable and no s/s of active bleeding  PEM, severe/Generalized Weakness:  -RD following, Vital 1.5 TF tra 2950ml/h + 50ml FWF q 2h and vitamin supplements  -ST tx plan when mentation improves  -PT/OT eval and tx on hold given severe encephalopathy and VDRF, reassess weekly for participation ability   GI proph: probiotic/pepcid  VTE proph: lovenox 40mg  [place] daily  Code Status: Full code   Family Communication:  Attempted to call son with update, left message, will try again tomorrow.  Prognosis: Grave  TOLER,HOWIS 06/28/2014 at 10:35 AM

## 2014-06-30 NOTE — Progress Notes (Signed)
Admit Date: 06/21/2014   Subjective:  Pt unable to participate in ROS 2/2 encephalopathy and remains unresponsive as noted since admission.  Objective: Vital signs in last 24 hours: Tmax:   99.72F  HR:   78-89 RR:  15-17  BP:   118/68-134/68 Pulse Ox:  98-100% on Vent: AC/VC 15/400/28/5 I/O:  2560/1725 BG:  213-316 LINE/FOLEY:  PICC/FOLEY/Rectal Tube  Physical Exam:   HEENT: [place]/AT, PERRL bilaterally, unable to test EOM 2/2 encephalopathy, ETT in place and secure, Sclera anicteric Periorbital edema and pt is not able to control her head/neck and favors left side  NECK: Supple, No LAD, JVD  Lungs: CTAb, no increase WOB, symmetrical chest rise with vent  CV: Normal S1, S2, grade 3/6 SEM best heard at LSB  ABD:S/NT/ND +BS X 4. No grimace with deep palpation  EXT: No cyanosis or clubbing bilaterally, +facial/UE edema, pulses 2+  NEURO: unable to test CN 2/2 encephalopathy, appears sedated despite no continuous sedation since Day#1 of admit  Skin: Warm, dry and intact   Lab Results No results found for this basename: WBC, HGB, HCT, PLATELETS, NA, K, CL, CO2, BUN, CREATININE, GLU,  in the last 72 hours Liver Panel No results found for this basename: PROT, ALBUMIN, AST, ALT, ALKPHOS, BILITOT, BILIDIR, IBILI,  in the last 72 hours Sedimentation Rate No results found for this basename: ESRSEDRATE,  in the last 72 hours C-Reactive Protein No results found for this basename: CRP,  in the last 72 hours  Microbiology: No results found for this or any previous visit (from the past 240 hour(s)).  Studies/Results: No results found.  Medications: I have reviewed the patient's current medications.  Assessment/Plan: continue with treatment plan as noted below  VDRF/Aspiration Pneumonia/HCAP (LLL):  E.coli, extended spectrum-lactamase UTI: abx course completed 10/14 -continue vent weaning protocol; failed weaning trial today and continues to have apnea even off sedation  -cont VAP  prevention protocol  -pulm following; agreed that pt is not a good candidate for trach given anoxic brain injury and severe disease burden -MPOA struggling with need to make patient comfort care or proceed with trach placement as ETT Day #17; decisions tomorrow -prn pCXR, ABG and BD; lab survey weekly -PT/OT tx for passive ROM  Severe Vascular Dementia/Acute Toxic Encephalopathy/Anoxic Brain Injury:  -mental status continues to serves as a barrier to extubation given good air exchange mechanics  -limitation to successful extubation  -neurochecks q shift  -attempt to avoid sedatives   Hypomagnesium: resolved -goal of 2.0, continue mag replacment protocol  -follow prn   Hypokalemia: resolved -replete to achieve goal of 4.0, continue potassium replacment protocol; normalize magnesium -follow prn   Hypophosphotemia: resolved -normalize with PO Phos-Nak, IV KPhos if severely depressed and/or telemetry changes   Seizure disorder:  -check keppra level, non-toxic, 34 -continue anticonvulsants: Keppra and Vimpat; prn Ativan IV if breakthrough seizure and order to notify physician  -continue seizure and aspiration precautions  -no seizures noted this admission  -CT of Head w/o contrast 10/8: negative for acute pathology   Hyperthyroidism, contributor of Tachycardia:  -H/o Hypothyroidism (previously was on Synthroid per son)  -checked TFT panel, normal Free T3 and depressed TSH; Free T3 confounded likely by steroids and infection; continue to monitor need of methimazole  -cont methimazole to 15mg  q 8h as pt has not achieved a euthyroid state in looking at PTA records and our stay and continues to exhibit signs of hyperthyroid state, recheck TFTs in 2 weeks  -cont coreg 25mg  PO BID to  assist with tachycardia, malignant HTN and CAD  -may be able to d/c methimazole at next panel recheck   Tachycardia: -resolved  Malignant HTN: normotensive at goal -titrate anti-hypertensive agents to goal  and tolerance -(Coreg, Hydralazine); no ARB as pt has ACE-I allergy of unknown type  Probable Aortic Stenosis: -see malignant HTN plan -did not order echo as it proves not added benefit to pt being that she is not a surgical candidate given ACS NSQIP calculated risk score and current clinical state.  DMII, uncontrolled: (hbg A1c 6.8)  -continue low dose Novolog SSI; titrate Levemir to goal and tolerance  -continue Plavix, Statin. Did not add ARB as unsure of ACE-I allergy   CDIFF, refractory: continued decrease in stool outpt today -cont Vanc 125mg  PO q 6h and Flagyl 500mg  IV q 8h x 10 days  -assess for fecal tube removal each shift   Acute on chronic pansinusitis:  -abx as noted above and cont Flonase daily   Anemia of chronic disease and iron def:  -checked iron panel, recommend tx as below and recheck panel in 3 months and adjust tx accordingly  -ferrous sulfate 325mg  PO BID + vitamin C 500mg  PO BID  -monitor CBC weekly given lovenox for VTE and Plavix, more often in clinically indicated  -H/H depressed but stable and no s/s of active bleeding  PEM, severe/GenRandoLPh Health MedRiver Valley BehCasa Col[facility] ReBuena Vista RBayfront Health BLaredo Rehabilitation HoDel [facility] TaArrowheSeven Hills BehEndoscopic Diagnostic AnSt. Vincent'S BirmRose80lanR([facility] NorthsideLenox HealthSt. Agnes Medical WekiwaNorthwest [facility] MagnolVibra Of Southeastern MiHorton Ba67y(4RAnn & RoAmesbury Sparrow Clinton HoSan Bernardi17noAroostoSaSilver Summit Medical [facility] Avic[facility] Stone Ci78tSurgery CThe Hospital At WeThe Surgery Center Dba AdCascade Eye And Skin CenWhitTri State Gastroenterology AssoFu26ltoR(9[facility]161[place]1w3dia-[id number](7RMethodist [facility] Care [id number] given severe encephalopathy and VDRF, reassess weekly for participation ability   GI proph: probiotic/pepcid  VTE proph: lovenox 40mg  [place] daily  Code Status: Full code   Family Communication: After multiple phone calls and messages, I was finally able to speak with son. He [place] and [place] to [place].  He stated he couldn't talk right away because he was renting a car but he would be at the hospital later tonight.  I informed him I would be gone by then and we would talk in the morning as he stated he would be at [facility].  Will discuss trach vs comfort care orders as pt must have ETT removed given we are at Day #17 and the risk of increased morbidity  with the persistent ETT is high.  Prognosis: Grave  TOLER,Aby Gessel 06/29/2014 at 07:55 AM

## 2014-07-01 ENCOUNTER — Institutional Professional Consult (permissible substitution) (HOSPITAL_COMMUNITY): Payer: Medicaid Other

## 2014-07-03 DIAGNOSIS — J9600 Acute respiratory failure, unspecified whether with hypoxia or hypercapnia: Secondary | ICD-10-CM | POA: Diagnosis not present

## 2014-07-03 DIAGNOSIS — T17998D Other foreign object in respiratory tract, part unspecified causing other injury, subsequent encounter: Secondary | ICD-10-CM | POA: Diagnosis not present

## 2014-07-03 LAB — URINALYSIS, ROUTINE W REFLEX MICROSCOPIC
Bilirubin Urine: NEGATIVE
Glucose, UA: NEGATIVE mg/dL
KETONES UR: NEGATIVE mg/dL
NITRITE: NEGATIVE
Protein, ur: 100 mg/dL — AB
Specific Gravity, Urine: 1.016 (ref 1.005–1.030)
Urobilinogen, UA: 0.2 mg/dL (ref 0.0–1.0)
pH: 8 (ref 5.0–8.0)

## 2014-07-03 LAB — URINE MICROSCOPIC-ADD ON

## 2014-07-03 NOTE — H&P (Signed)
  PREOPERATIVE H&P  Chief Complaint: chronic respiratory failure  HPI: Michele Guerra is a 73 y.o. female who presents for evaluation of chronic respiratory failure. She has history of diabetes, dementia and is s/p seizure with anoxic brain damage requiring intubation and ventilatory support 4 weeks ago. Attempted extubation was unsuccessful and tracheotomy was recommended to family. She's taken to the OR now for tracheostomy.  Past Medical History  Diagnosis Date  . Dementia     Reportedly Severe  . Dysphagia   . HTN (hypertension)   . Diabetes mellitus   . Recurrent UTI   . Hyperthyroidism    No past surgical history on file. History   Social History  . Marital Status: Single    Spouse Name: N/A    Number of Children: N/A  . Years of Education: N/A   Social History Main Topics  . Smoking status: Not on file  . Smokeless tobacco: Not on file  . Alcohol Use: Not on file  . Drug Use: Not on file  . Sexual Activity: Not on file   Other Topics Concern  . Not on file   Social History Narrative  . No narrative on file   No family history on file. Allergies not on file Prior to Admission medications   Not on File     Positive ROS: n/a  All other systems have been reviewed and were otherwise negative with the exception of those mentioned in the HPI and as above.  Physical Exam: There were no vitals filed for this visit.  Intubated and non responsive. Neck: No palpable adenopathy or thyroid nodules. Michele Guerra is midline Cardiovascular: Regular rate and rhythm, no murmur.  Respiratory: Clear to auscultation   Assessment/Plan: Respiratory distress. Chronic respiratory failure Plan for Procedure(s): TRACHEOSTOMY   Dillard CannonNEWMAN, CHRISTOPHER, MD 07/03/2014 5:33 PM

## 2014-07-03 NOTE — Progress Notes (Signed)
   Name: Michele Guerra MRN: [id number] DOB: 04/12/1941    ADMISSION DATE:  06/21/2014 CONSULTATION DATE:  06/21/2014  REFERRING MD :  Dr. Sharyon MedicusHijazi  CHIEF COMPLAINT:  VDRF  BRIEF PATIENT DESCRIPTION: 73 year old [facility] and intubated for status epilepticus. She failed extubation twice while there, and [facility] PCCM contacted for ventilator assistance.   SIGNIFICANT EVENTS  [facility]29 [facility]> intubated 10/2 - failed extubation 10/5 - began following commands, failed extubation.  [facility] vent mechanics look good but still not awake enough to protect airway.  10/9 apneic during waning trials. No sedation over night. 10/12 currently on full vent support. 10/13 family does not want trach. 10/14 still not awake enough to extubate 10/15 not awake. No family decision concerning terminal extubation  STUDIES:  10/2 CT head > no acute intracranial abnormality, chronic vascular changes and atrophy.   LINES/TUBES OETT 9/29 >>> PICC 10/2 >>>   INTERVAL: Weaning on 10/5  VITAL SIGNS: Vital signs reviewed. Abnormal values will appear under impression plan section.     PHYSICAL EXAMINATION: General:  Female of normal body habitus in NAD on ventilator Neuro:  , RASS -2 despite no sedation. , grimaces to pain,does not follow commands HEENT:  [place]/AT, ETT in place, No JVD noted, left periorbital; edema  Cardiovascular:  RRR, SEM 2/6 Lungs:  Clear anterior breath sounds. Unlabored, synchronous with vent.  Abdomen:  Soft, non-distended, BS normoactive. TF running Musculoskeletal:  No acute deformity.  Skin:  Intact   Recent Labs Lab 06/27/14 0522  NA 134*  K 4.9  CL 95*  CO2 29  BUN 11  CREATININE 0.67  GLUCOSE 282*    Recent Labs Lab 06/27/14 0522  HGB 9.1*  HCT 26.5*   ABG    Component Value Date/Time   PHART 7.452* 06/22/2014 1055   PCO2ART 38.4 06/22/2014 1055   PO2ART 119.0*  06/22/2014 1055   HCO3 26.5* 06/22/2014 1055   TCO2 27.6 06/22/2014 1055   O2SAT 98.9 06/22/2014 1055    Dg Chest Port 1 View  07/01/2014   CLINICAL DATA:  Fever.  EXAM: PORTABLE CHEST - 1 VIEW  COMPARISON:  Single view of the chest 06/27/2014.  FINDINGS: NG tube courses into the stomach and below the inferior margin of film. Right PICC and ET tube remain in place in good position. Lungs appear clear. No pneumothorax or pleural effusion. Heart size is normal.  IMPRESSION: Support apparatus is unchanged.  No acute disease.   Electronically Signed   By: Drusilla Kannerhomas  Dalessio M.D.   On: 07/01/2014 18:22    ASSESSMENT / PLAN:  Acute Respiratory Failure s/p seizures HCAP vs aspiration  Severe dementia Anoxic brain injury HTN  DM  Cdiff colitis  GPC clusters 1/2 blood  10/17   Discussion  Gas exchange looks good. Mechanics are favorable but MS prevents extubation at this point.  Carries dx of anoxic brain injury. Family declined trach option 10/13 but now Im told son is agreeable  recs -Options include one way extubation to comfort vs trach -need further discussion with son to clarify goals of care - VAP prevention per protocol - Abx per primary service  - PRN albuterol - Taper prednisone to off    [name], MD  [phone]

## 2014-07-04 ENCOUNTER — Encounter (HOSPITAL_COMMUNITY): Payer: Medicaid Other | Admitting: Anesthesiology

## 2014-07-04 ENCOUNTER — Other Ambulatory Visit (HOSPITAL_COMMUNITY): Payer: Medicaid Other

## 2014-07-04 ENCOUNTER — Encounter (HOSPITAL_COMMUNITY): Payer: Self-pay | Admitting: Certified Registered"

## 2014-07-04 ENCOUNTER — Encounter
Admission: AD | Disposition: A | Discharge status: Skilled Nursing Facility | Payer: Self-pay | Source: Ambulatory Visit | Attending: Internal Medicine

## 2014-07-04 HISTORY — PX: TRACHEOSTOMY TUBE PLACEMENT: SHX814

## 2014-07-04 LAB — CULTURE, RESPIRATORY W GRAM STAIN

## 2014-07-04 LAB — CULTURE, BLOOD (ROUTINE X 2)

## 2014-07-04 LAB — CULTURE, RESPIRATORY

## 2014-07-04 LAB — CBC WITH DIFFERENTIAL/PLATELET
BASOS ABS: 0 10*3/uL (ref 0.0–0.1)
Basophils Relative: 0 % (ref 0–1)
EOS ABS: 0.3 10*3/uL (ref 0.0–0.7)
Eosinophils Relative: 3 % (ref 0–5)
HCT: 24.7 % — ABNORMAL LOW (ref 36.0–46.0)
Hemoglobin: 8.5 g/dL — ABNORMAL LOW (ref 12.0–15.0)
LYMPHS ABS: 2.4 10*3/uL (ref 0.7–4.0)
LYMPHS PCT: 22 % (ref 12–46)
MCH: 24.5 pg — ABNORMAL LOW (ref 26.0–34.0)
MCHC: 34.4 g/dL (ref 30.0–36.0)
MCV: 71.2 fL — AB (ref 78.0–100.0)
Monocytes Absolute: 1.8 10*3/uL — ABNORMAL HIGH (ref 0.1–1.0)
Monocytes Relative: 16 % — ABNORMAL HIGH (ref 3–12)
NEUTROS ABS: 6.5 10*3/uL (ref 1.7–7.7)
Neutrophils Relative %: 59 % (ref 43–77)
PLATELETS: 606 10*3/uL — AB (ref 150–400)
RBC: 3.47 MIL/uL — AB (ref 3.87–5.11)
RDW: 16.4 % — AB (ref 11.5–15.5)
WBC: 11 10*3/uL — AB (ref 4.0–10.5)

## 2014-07-04 LAB — BASIC METABOLIC PANEL
Anion gap: 10 (ref 5–15)
BUN: 16 mg/dL (ref 6–23)
CHLORIDE: 98 meq/L (ref 96–112)
CO2: 29 mEq/L (ref 19–32)
Calcium: 9.6 mg/dL (ref 8.4–10.5)
Creatinine, Ser: 0.7 mg/dL (ref 0.50–1.10)
GFR calc non Af Amer: 83 mL/min — ABNORMAL LOW (ref >90)
Glucose, Bld: 48 mg/dL — ABNORMAL LOW (ref 70–99)
POTASSIUM: 4.3 meq/L (ref 3.7–5.3)
Sodium: 137 mEq/L (ref 137–147)

## 2014-07-04 SURGERY — CREATION, TRACHEOSTOMY
Anesthesia: General

## 2014-07-04 MED ORDER — FENTANYL CITRATE 0.05 MG/ML IJ SOLN
INTRAMUSCULAR | Status: AC
  Filled 2014-07-04: qty 5

## 2014-07-04 MED ORDER — MIDAZOLAM HCL 2 MG/2ML IJ SOLN
INTRAMUSCULAR | Status: AC
  Filled 2014-07-04: qty 2

## 2014-07-04 MED ORDER — MIDAZOLAM HCL 5 MG/5ML IJ SOLN
INTRAMUSCULAR | Status: DC | PRN
  Administered 2014-07-04: 2 mg via INTRAVENOUS

## 2014-07-04 MED ORDER — FENTANYL CITRATE 0.05 MG/ML IJ SOLN
INTRAMUSCULAR | Status: DC | PRN
  Administered 2014-07-04: 50 ug via INTRAVENOUS

## 2014-07-04 MED ORDER — LACTATED RINGERS IV SOLN
INTRAVENOUS | Status: DC | PRN
  Administered 2014-07-04: 07:00:00 via INTRAVENOUS

## 2014-07-04 MED ORDER — LIDOCAINE-EPINEPHRINE 1 %-1:100000 IJ SOLN
INTRAMUSCULAR | Status: DC | PRN
  Administered 2014-07-04: 5 mL

## 2014-07-04 MED ORDER — ROCURONIUM BROMIDE 100 MG/10ML IV SOLN
INTRAVENOUS | Status: DC | PRN
  Administered 2014-07-04: 50 mg via INTRAVENOUS

## 2014-07-04 MED ORDER — 0.9 % SODIUM CHLORIDE (POUR BTL) OPTIME
TOPICAL | Status: DC | PRN
  Administered 2014-07-04: 1000 mL

## 2014-07-04 SURGICAL SUPPLY — 41 items
ATTRACTOMAT 16X20 MAGNETIC DRP (DRAPES) IMPLANT
BLADE SURG 15 STRL LF DISP TIS (BLADE) ×1 IMPLANT
BLADE SURG 15 STRL SS (BLADE) ×2
CLEANER TIP ELECTROSURG 2X2 (MISCELLANEOUS) ×3 IMPLANT
COVER SURGICAL LIGHT HANDLE (MISCELLANEOUS) ×3 IMPLANT
ELECT COATED BLADE 2.86 ST (ELECTRODE) ×3 IMPLANT
ELECT REM PT RETURN 9FT ADLT (ELECTROSURGICAL) ×3
ELECTRODE REM PT RTRN 9FT ADLT (ELECTROSURGICAL) ×1 IMPLANT
GAUZE SPONGE 4X4 16PLY XRAY LF (GAUZE/BANDAGES/DRESSINGS) ×3 IMPLANT
GLOVE BIO SURGEON STRL SZ7.5 (GLOVE) ×3 IMPLANT
GLOVE BIOGEL PI IND STRL 7.0 (GLOVE) ×1 IMPLANT
GLOVE BIOGEL PI IND STRL 7.5 (GLOVE) ×1 IMPLANT
GLOVE BIOGEL PI INDICATOR 7.0 (GLOVE) ×2
GLOVE BIOGEL PI INDICATOR 7.5 (GLOVE) ×2
GLOVE SS BIOGEL STRL SZ 7.5 (GLOVE) ×2 IMPLANT
GLOVE SUPERSENSE BIOGEL SZ 7.5 (GLOVE) ×4
GOWN STRL REUS W/ TWL LRG LVL3 (GOWN DISPOSABLE) ×1 IMPLANT
GOWN STRL REUS W/ TWL XL LVL3 (GOWN DISPOSABLE) ×1 IMPLANT
GOWN STRL REUS W/TWL LRG LVL3 (GOWN DISPOSABLE) ×2
GOWN STRL REUS W/TWL XL LVL3 (GOWN DISPOSABLE) ×2
HOLDER TRACH TUBE VELCRO 19.5 (MISCELLANEOUS) ×3 IMPLANT
KIT BASIN OR (CUSTOM PROCEDURE TRAY) ×3 IMPLANT
KIT ROOM TURNOVER OR (KITS) ×3 IMPLANT
KIT SUCTION CATH 14FR (SUCTIONS) ×3 IMPLANT
NEEDLE HYPO 25X1 1.5 SAFETY (NEEDLE) ×3 IMPLANT
NS IRRIG 1000ML POUR BTL (IV SOLUTION) ×3 IMPLANT
PACK EENT II TURBAN DRAPE (CUSTOM PROCEDURE TRAY) ×3 IMPLANT
PAD ARMBOARD 7.5X6 YLW CONV (MISCELLANEOUS) ×6 IMPLANT
PENCIL BUTTON HOLSTER BLD 10FT (ELECTRODE) ×3 IMPLANT
SPONGE DRAIN TRACH 4X4 STRL 2S (GAUZE/BANDAGES/DRESSINGS) ×3 IMPLANT
SPONGE INTESTINAL PEANUT (DISPOSABLE) ×3 IMPLANT
SUT SILK 2 0 SH CR/8 (SUTURE) ×3 IMPLANT
SUT SILK 3 0 TIES 10X30 (SUTURE) ×3 IMPLANT
SYR 20CC LL (SYRINGE) ×3 IMPLANT
SYR CONTROL 10ML LL (SYRINGE) ×3 IMPLANT
TOWEL OR 17X26 10 PK STRL BLUE (TOWEL DISPOSABLE) ×3 IMPLANT
TUBE CONNECTING 12'X1/4 (SUCTIONS) ×1
TUBE CONNECTING 12X1/4 (SUCTIONS) ×2 IMPLANT
TUBE TRACH SHILEY  6 DIST  CUF (TUBING) ×3 IMPLANT
TUBE TRACH SHILEY 10 DIST CUFF (TUBING) IMPLANT
TUBE TRACH SHILEY 8 DIST CUF (TUBING) IMPLANT

## 2014-07-04 NOTE — Anesthesia Preprocedure Evaluation (Addendum)
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    GI/Hepatic   Endo/Other  diabetes, Type 2Hyperthyroidism   Renal/GU      Musculoskeletal   Abdominal   Peds  Hematology   Anesthesia Other Findings   Reproductive/Obstetrics                          Anesthesia Physical Anesthesia Plan  ASA: IV  Anesthesia Plan: General   Post-op Pain Management:    Induction: Intravenous  Airway Management Planned:   Additional Equipment:   Intra-op Plan:   Post-operative Plan: Post-operative intubation/ventilation  Informed Consent: I have reviewed the patients History and Physical, chart, labs and discussed the procedure including the risks, benefits and alternatives for the proposed anesthesia with the patient or authorized representative who has indicated his/her understanding and acceptance.     Plan Discussed with: CRNA, Anesthesiologist and Surgeon  Anesthesia Plan Comments:        Anesthesia Quick Evaluation

## 2014-07-04 NOTE — Interval H&P Note (Signed)
History and Physical Interval Note:  07/04/2014 6:59 AM  Michele Guerra  has presented today for surgery, with the diagnosis of Respiratory distress  The various methods of treatment have been discussed with the patient and family. After consideration of risks, benefits and other options for treatment, the patient has consented to  Procedure(s): TRACHEOSTOMY (N/A) as a surgical intervention .  The patient's history has been reviewed, patient examined, no change in status, stable for surgery.  I have reviewed the patient's chart and labs.  Questions were answered to the patient's satisfaction.     NEWMAN, CHRISTOPHER

## 2014-07-04 NOTE — Brief Op Note (Signed)
06/21/2014 - 07/04/2014  7:37 AM  PATIENT:  Madissen D Errico  73 y.o. female  PRE-OPERATIVE DIAGNOSIS:  Respiratory distress  POST-OPERATIVE DIAGNOSIS:  Respiratory distress  PROCEDURE:  Procedure(s): TRACHEOSTOMY (N/A)  SURGEON:  Surgeon(s) and Role:    * Drema Halonhristopher E Gargi Berch, MD - Primary  PHYSICIAN ASSISTANT:   ASSISTANTS: none   ANESTHESIA:   general  EBL:     BLOOD ADMINISTERED:none  DRAINS: none   LOCAL MEDICATIONS USED:  XYLOCAINE with EPI  5 cc  SPECIMEN:  No Specimen  DISPOSITION OF SPECIMEN:  N/A  COUNTS:  YES  TOURNIQUET:  * No tourniquets in log *  DICTATION: .Other Dictation: Dictation Number [id number]  PLAN OF CARE: Discharge to home after PACU  PATIENT DISPOSITION:  PACU - hemodynamically stable.   Delay start of Pharmacological VTE agent (>24hrs) due to surgical blood loss or risk of bleeding: not applicable

## 2014-07-04 NOTE — Transfer of Care (Signed)
Immediate Anesthesia Transfer of Care Note  Patient: Michele Guerra  Procedure(s) Performed: Procedure(s): TRACHEOSTOMY (N/A)  Patient Location: Nursing Unit  Anesthesia Type:General  Level of Consciousness: Patient remains intubated per anesthesia plan  Airway & Oxygen Therapy: Patient remains intubated per anesthesia plan and Patient placed on Ventilator (see vital sign flow sheet for setting)  Post-op Assessment: Report given to PACU RN and Post -op Vital signs reviewed and stable  Post vital signs: Reviewed and stable  Complications: No apparent anesthesia complications

## 2014-07-04 NOTE — Consult Note (Signed)
NAMJanann Guerra:  Dobies, Adrina              ACCOUNT NO.:  [id number]  MEDICAL RECORD NO.:  [id number]  LOCATION:                                 FACILITY:  PHYSICIAN:  Kristine GarbeChristopher E. Ezzard StandingNewman, M.D. DATE OF BIRTH:  DATE OF CONSULTATION: DATE OF DISCHARGE:                                CONSULTATION   REASON FOR CONSULTATION:  Evaluate the patient for tracheostomy.  BRIEF HISTORY:  Ms. Michele Guerra is a 73 year old female with history of diabetes, hypothyroidism, hypertension, and advanced dementia, who was admitted to [facility] on June 13, 2014, following an episode of status epilepticus and anoxic encephalopathy.  She required mechanical ventilation.  During hospitalization, there were 2 attempted extubation attempts that the patient required re-intubation both times. [facility]. She has remained intubated since that time, and on discussion with family members, I elected to proceed with tracheotomy.  PHYSICAL EXAMINATION:  The patient is not responsive.  She has been intubated.  Michele Guerra is palpably in midline with no neck masses around the trach.  IMPRESSION:  Acute and chronic respiratory failure, intubated now 20+ days on ventilator.  PLAN:  We will plan tracheotomy tomorrow morning after discussion with son.          ______________________________ Kristine Garbehristopher E. Ezzard StandingNewman, M.D.     CEN/MEDQ  D:  07/03/2014  T:  07/04/2014  Job:  [id number]

## 2014-07-04 NOTE — Op Note (Signed)
NAMJanann Colonel:  Verba, Shelagh              ACCOUNT NO.:  [id number]  MEDICAL RECORD NO.:  [id number]  LOCATION:                                 FACILITY:  PHYSICIAN:  Kristine GarbeChristopher E. Ezzard StandingNewman, M.D. DATE OF BIRTH:  DATE OF PROCEDURE:07/04/14 DATE OF DISCHARGE:                              OPERATIVE REPORT   PREOPERATIVE DIAGNOSIS:  Acute and chronic respiratory failure.  POSTOPERATIVE DIAGNOSIS:  Acute and chronic respiratory failure.  OPERATION PERFORMED:  Tracheotomy with a #6 Shiley trach.  SURGEON:  Kristine GarbeChristopher E. Ezzard StandingNewman, M.D.  ANESTHESIA:  General endotracheal.  COMPLICATIONS:  None.  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  She was [facility] because of prolonged intubation and chronic respiratory care.  She has been intubated now for 4 weeks.  Attempt at extubation was unsuccessful x2.  She was taken to operating room at this time for tracheotomy.  DESCRIPTION OF PROCEDURE:  The [facility] was [facility] to the OR and was positioned on the bed.  Neck was prepped and injected with 5 mL of Xylocaine with epinephrine in midline over the proposed incision site.  Vertical incision was made just above the suprasternal notch.  Dissection was carried down through the subcutaneous tissue.  Strap muscles were divided in the midline and retracted laterally.  The thyroid isthmus was identified and divided with the cautery.  There was minimal bleeding.  The first 3 tracheal rings were identified and a transverse incision was made between the second and third tracheal rings.  The endotracheal tube was removed and a #6 Shiley tracheotomy tube was inserted without any difficulty.  The patient had ventilation status post placement of the tracheotomy tube. This was then secured with 2-0 silk sutures to 4  corners and trach tape around the neck.  Following this, an OG tube was replaced with a nasogastric tube.  The placement was confirmed with a Toomey syringe and auscultation of the stomach.  This completed procedure.  The patient was subsequently [facility].          ______________________________ Kristine Garbehristopher E. Ezzard StandingNewman, M.D.    CEN/MEDQ  D:  07/04/2014  T:  07/04/2014  Job:  [id number]

## 2014-07-04 NOTE — OR Nursing (Signed)
P. HAWKS RN NOTIFIED S. JEFFERIES RN AT [phone] OF [facility] THAT THE PATIENT WILL BE RETURNED TO THEM AND THEY SHOULD HAVE VENTILATOR READY IN PATIENT ROOM.

## 2014-07-05 ENCOUNTER — Other Ambulatory Visit (HOSPITAL_COMMUNITY): Payer: Medicaid Other

## 2014-07-05 ENCOUNTER — Encounter (HOSPITAL_COMMUNITY): Payer: Self-pay | Admitting: Otolaryngology

## 2014-07-05 DIAGNOSIS — Z930 Tracheostomy status: Secondary | ICD-10-CM

## 2014-07-05 DIAGNOSIS — J9600 Acute respiratory failure, unspecified whether with hypoxia or hypercapnia: Secondary | ICD-10-CM | POA: Diagnosis not present

## 2014-07-05 LAB — CLOSTRIDIUM DIFFICILE BY PCR: Toxigenic C. Difficile by PCR: NEGATIVE

## 2014-07-05 NOTE — Progress Notes (Signed)
Name: Michele Guerra MRN: [id number] DOB: 06/22/1941    ADMISSION DATE:  06/21/2014 CONSULTATION DATE:  06/21/2014  REFERRING MD :  Dr. Sharyon MedicusHijazi  CHIEF COMPLAINT:  VDRF  BRIEF PATIENT DESCRIPTION: 73 year old female admitted [facility] and intubated for status epilepticus. She failed extubation twice while there, and [facility] PCCM contacted for ventilator assistance.   SIGNIFICANT EVENTS  [facility]29 - [facility]> intubated 10/2 - failed extubation 10/5 - began following commands, failed extubation.  [facility]: vent mechanics look good but still not awake enough to protect airway.  10/9 apneic during waning trials. No sedation over night. 10/12 currently on full vent support. 10/13 family does not want trach. 10/14 still not awake enough to extubate 10/15 not awake. No family decision concerning terminal extubation  STUDIES:  10/2 CT head > no acute intracranial abnormality, chronic vascular changes and atrophy.   LINES/TUBES OETT 9/29 >>>10/20 trach (newman)  PICC 10/2 >>>   INTERVAL: Weaning on 10/5  VITAL SIGNS: Vital signs reviewed. Abnormal values will appear under impression plan section.     PHYSICAL EXAMINATION: General:  Female of normal body habitus in NAD on ventilator Neuro:  , RASS -2 despite no sedation. , grimaces to pain,does not follow commands HEENT:  [place]/AT, trach in place, No JVD noted, left periorbital; edema  Cardiovascular:  RRR, SEM 2/6 Lungs:  Clear anterior breath sounds. Unlabored, synchronous with vent.  Abdomen:  Soft, non-distended, BS normoactive. TF running Musculoskeletal:  No acute deformity.  Skin:  Intact   Recent Labs Lab 07/04/14 0515  NA 137  K 4.3  CL 98  CO2 29  BUN 16  CREATININE 0.70  GLUCOSE 48*    Recent Labs Lab 07/04/14 0515  HGB 8.5*  HCT 24.7*  WBC 11.0*  PLT 606*   ABG    Component Value Date/Time   PHART 7.452* 06/22/2014 1055   PCO2ART 38.4 06/22/2014 1055   PO2ART 119.0* 06/22/2014 1055   HCO3 26.5* 06/22/2014 1055   TCO2 27.6 06/22/2014 1055   O2SAT 98.9 06/22/2014 1055    Dg Chest Port 1 View  07/04/2014   CLINICAL DATA:  Status post tracheostomy tube placement.  EXAM: PORTABLE CHEST - 1 VIEW  COMPARISON:  Single view of the chest 07/01/2014.  FINDINGS: Endotracheal tube has been exchange for a new tracheostomy tube which projects in good position. NG tube courses into the stomach and below the inferior margin of the film. Left PICC is now identified with the tip in the lower superior vena cava. Right PICC has been removed.  There has been some increase in basilar airspace disease, worse on the left. No pneumothorax. Heart size normal.  IMPRESSION: Tracheostomy tube projects in good position.  New left PICC projects with its tip in the lower superior vena cava.  Increased left greater than right basilar airspace disease, likely atelectasis.   Electronically Signed   By: Drusilla Kannerhomas  Dalessio M.D.   On: 07/04/2014 19:02   Dg Abd Portable 1v  07/04/2014   CLINICAL DATA:  73 year old female -NG tube placement.  EXAM: PORTABLE ABDOMEN - 1 VIEW  COMPARISON:  06/28/2014  FINDINGS: An NG tube is identified with tip overlying the mid stomach.  The bowel gas pattern is unremarkable.  Pelvic calcifications again noted probably uterine fibroids.  IMPRESSION: NG tube with tip overlying the mid stomach.   Electronically Signed   By: Dolores FrameJeff  Hu M.D.  On: 07/04/2014 12:21    ASSESSMENT / PLAN:  Acute Respiratory Failure s/p seizures HCAP vs aspiration -MRSA 10/17 Moderate dementia -wheelchair bound prior to current illness Anoxic brain injury -was responsive prior to [facility] per son HTN  DM  Cdiff colitis     Discussion   Carries dx of anoxic brain injury. Now s/p trach . Hope that we can liberate her from the vent soon to enable placement  recs - wean as tolerated; fast track wean protocol to ATC as tolerated - VAP  prevention per protocol - Abx per primary service  - PRN albuterol - Taper prednisone to off  Detailed d/w son prior to trach   Adrijana Haros V.   [phone]

## 2014-07-05 NOTE — Anesthesia Postprocedure Evaluation (Signed)
  Anesthesia Post-op Note  Patient: Michele Guerra  Procedure(s) Performed: Procedure(s): TRACHEOSTOMY (N/A)  Patient Location: PACU and Nursing Unit  Anesthesia Type:General  Level of Consciousness: sedated  Airway and Oxygen Therapy: Patient remains intubated per anesthesia plan  Post-op Pain: none  Post-op Assessment: Post-op Vital signs reviewed, Patient's Cardiovascular Status Stable and Respiratory Function Stable  Post-op Vital Signs: Reviewed and stable  Last Vitals: There were no vitals filed for this visit.  Complications: No apparent anesthesia complications

## 2014-07-06 LAB — PROTIME-INR
INR: 1.28 (ref 0.00–1.49)
Prothrombin Time: 16.1 seconds — ABNORMAL HIGH (ref 11.6–15.2)

## 2014-07-07 DIAGNOSIS — J9600 Acute respiratory failure, unspecified whether with hypoxia or hypercapnia: Secondary | ICD-10-CM | POA: Diagnosis not present

## 2014-07-07 DIAGNOSIS — Z930 Tracheostomy status: Secondary | ICD-10-CM | POA: Diagnosis not present

## 2014-07-07 LAB — TSH: TSH: 0.167 u[IU]/mL — AB (ref 0.350–4.500)

## 2014-07-07 LAB — CULTURE, BLOOD (ROUTINE X 2): Culture: NO GROWTH

## 2014-07-07 LAB — VANCOMYCIN, TROUGH: VANCOMYCIN TR: 9 ug/mL — AB (ref 10.0–20.0)

## 2014-07-07 LAB — T3, FREE: T3, Free: 2.8 pg/mL (ref 2.3–4.2)

## 2014-07-07 LAB — T4, FREE: Free T4: 0.78 ng/dL — ABNORMAL LOW (ref 0.80–1.80)

## 2014-07-07 NOTE — Progress Notes (Signed)
Name: Michele Guerra MRN: [id number] DOB: 07/04/1941    ADMISSION DATE:  06/21/2014 CONSULTATION DATE:  06/21/2014  REFERRING MD :  Dr. Sharyon MedicusHijazi  CHIEF COMPLAINT:  VDRF  BRIEF PATIENT DESCRIPTION: 73 [facility] and [facility]ated for status epilepticus. She failed extubation twice while there, and [facility]. PCCM contacted for ventilator assistance.   SIGNIFICANT EVENTS  9/29 - [facility]> intubated 10/2 - failed extubation 10/5 - began following commands, failed extubation.  [facility] vent mechanics look good but still not awake enough to protect airway.  10/9 apneic during waning trials. No sedation over night. 10/12 currently on full vent support. 10/13 family does not want trach. 10/14 still not awake enough to extubate 10/15 not awake. No family decision concerning terminal extubation  STUDIES:  10/2 CT head > no acute intracranial abnormality, chronic vascular changes and atrophy.   LINES/TUBES OETT 9/29 >>>10/20 trach (newman)  PICC 10/2 >>>   INTERVAL: Weaning on ATC  VITAL SIGNS: Vital signs reviewed. Abnormal values will appear under impression plan section.     PHYSICAL EXAMINATION: General:  Female of normal body habitus in NAD on ventilator Neuro:  , RASS -2 despite no sedation. , grimaces to pain,does not follow commands HEENT:  [place]/AT, trach in place, No JVD noted, left periorbital; edema  Cardiovascular:  RRR, SEM 2/6 Lungs:  Clear anterior breath sounds. Unlabored, synchronous with vent.  Abdomen:  Soft, non-distended, BS normoactive. TF running Musculoskeletal:  No acute deformity.  Skin:  Intact   Recent Labs Lab 07/04/14 0515  NA 137  K 4.3  CL 98  CO2 29  BUN 16  CREATININE 0.70  GLUCOSE 48*    Recent Labs Lab 07/04/14 0515  HGB 8.5*  HCT 24.7*  WBC 11.0*  PLT 606*   ABG    Component Value Date/Time   PHART 7.452* 06/22/2014 1055   PCO2ART 38.4 06/22/2014 1055   PO2ART 119.0* 06/22/2014 1055   HCO3 26.5* 06/22/2014 1055   TCO2 27.6 06/22/2014 1055   O2SAT 98.9 06/22/2014 1055    Ct Abdomen Wo Contrast  07/05/2014   CLINICAL DATA:  73 year old female with malnutrition and Clostridium difficile colitis. Percutaneous gastrostomy tube placement requested. Initial encounter.  EXAM: CT ABDOMEN WITHOUT CONTRAST  TECHNIQUE: Multidetector CT imaging of the abdomen was performed following the standard protocol without IV contrast.  COMPARISON:  Abdomen radiographs 07/04/2014 and earlier.  FINDINGS: Bilateral lower lobe consolidation with air bronchograms. Suspect small volume of superimposed bilateral pleural fluid. No pericardial effusion.  No acute osseous abnormality identified. Mild chronic appearing superior endplate fracture of T12. Chronic appearing bulky L3 superior endplate Schmorl's node and/or fracture. There is a linear radiopaque object within the lumbar thecal sac extending from the L3-L4 to the L5-S1 level (see sagittal image 73).  Fluid and/or dilute contrast in the colon. The transverse colon is mildly redundant and extends down almost to the level of the umbilicus. No colonic wall thickening identified. No dilated small bowel. NG tube in place, terminates at the proximal third of the stomach. The stomach and duodenum are decompressed.  Bulky densely calcified gallstone in the gallbladder measuring 3-4 cm. No definite pericholecystic inflammation. Negative non contrast liver, spleen, pancreas, and adrenal glands (adrenal hyperplasia). No hydronephrosis. Low-density area in the left renal midpole with simple fluid densitometry (e.g. cyst). No pneumoperitoneum identified.  IMPRESSION: 1. NG tube terminates within the proximal third of  the stomach. The transverse colon is mildly redundant and extends caudally in the midline all moles to the umbilicus. 2. Bilateral lower lobe consolidation/pneumonia. Perhaps trace superimposed pleural  effusions. 3. Bulky gallstone within the gallbladder. 4. Linear foreign body in the lumbar thecal sac extending from L3-L4 to L5-S1.   Electronically Signed   By: Augusto GambleLee  Hall M.D.   On: 07/05/2014 23:18    ASSESSMENT / PLAN:  Acute Respiratory Failure s/p seizures HCAP vs aspiration -MRSA 10/17 Moderate dementia -wheelchair bound prior to current illness Anoxic brain injury -was responsive prior to transfer to [facility] per son HTN  DM  Cdiff colitis     Discussion   Carries dx of anoxic brain injury. Now s/p trach . Hope that we can liberate her from the vent soon   recs - wean as tolerated; fast track wean protocol to ATC as tolerated - VAP prevention per protocol - Abx per primary service  - PRN albuterol - Taper prednisone to off  Detailed d/w son prior to trach - he would prefer placement where family is present, hope we can get to trach/SNF once liberated from vent  Hadlei Stitt V.   [phone]

## 2014-07-08 LAB — CBC
HEMATOCRIT: 25.9 % — AB (ref 36.0–46.0)
Hemoglobin: 8.9 g/dL — ABNORMAL LOW (ref 12.0–15.0)
MCH: 23.8 pg — ABNORMAL LOW (ref 26.0–34.0)
MCHC: 34.4 g/dL (ref 30.0–36.0)
MCV: 69.3 fL — ABNORMAL LOW (ref 78.0–100.0)
PLATELETS: 506 10*3/uL — AB (ref 150–400)
RBC: 3.74 MIL/uL — AB (ref 3.87–5.11)
RDW: 16 % — AB (ref 11.5–15.5)
WBC: 8.2 10*3/uL (ref 4.0–10.5)

## 2014-07-08 NOTE — Consult Note (Signed)
Reason for consult: gastrostomy tube placement  Referring Physician(s): Dr. Sharyon MedicusHijazi  History of Present Illness: Michele Guerra is a 73 y.o. female who was [facility] and intubated for status epilepticus. She failed extubation twice while there, and was [facility]. A tracheostomy was placed on 07/04/14. She also has history of anoxic brain injury/moderate dementia, HTN, DM, prior c diff colitis- currently neg, MRSA PNA (on vancomycin), recurrent UTI,  malnutrition/dysphagia- receiving feeds via NG.  Request has been received for percutaneous gastrostomy tube placement.      Past Medical History  Diagnosis Date  . Dementia     Reportedly Severe  . Dysphagia   . HTN (hypertension)   . Diabetes mellitus   . Recurrent UTI   . Hyperthyroidism     Past Surgical History  Procedure Laterality Date  . Tracheostomy tube placement N/A 07/04/2014    Procedure: TRACHEOSTOMY;  Surgeon: Drema Halonhristopher E Newman, MD;  [facility];  Service: ENT;  Laterality: N/A;    Allergies: Review of patient's allergies indicates not on file.  Medications: Prior to Admission medications   Not on File    No family history on file.  History   Social History  . Marital Status: Single    Spouse Name: N/A    Number of Children: N/A  . Years of Education: N/A   Social History Main Topics  . Smoking status: None  . Smokeless tobacco: None  . Alcohol Use: None  . Drug Use: None  . Sexual Activity: None   Other Topics Concern  . None   Social History Narrative  . None         Review of Systems unable to obtain due to clinical state  Vital Signs: BP 124/62  HR 77  R 16  TEMP 98.3  O2 SATS 100% (TRACH) Physical Exam  Pt not F/C; will occ open eyes per nurse; NG in place; chest- dim BS bases; heart- RRR; abd- soft,+BS,NT; ext- no sig edema, both feet in boots  Imaging: Ct Abdomen Wo Contrast  07/05/2014   CLINICAL DATA:  73 year old female  with malnutrition and Clostridium difficile colitis. Percutaneous gastrostomy tube placement requested. Initial encounter.  EXAM: CT ABDOMEN WITHOUT CONTRAST  TECHNIQUE: Multidetector CT imaging of the abdomen was performed following the standard protocol without IV contrast.  COMPARISON:  Abdomen radiographs 07/04/2014 and earlier.  FINDINGS: Bilateral lower lobe consolidation with air bronchograms. Suspect small volume of superimposed bilateral pleural fluid. No pericardial effusion.  No acute osseous abnormality identified. Mild chronic appearing superior endplate fracture of T12. Chronic appearing bulky L3 superior endplate Schmorl's node and/or fracture. There is a linear radiopaque object within the lumbar thecal sac extending from the L3-L4 to the L5-S1 level (see sagittal image 73).  Fluid and/or dilute contrast in the colon. The transverse colon is mildly redundant and extends down almost to the level of the umbilicus. No colonic wall thickening identified. No dilated small bowel. NG tube in place, terminates at the proximal third of the stomach. The stomach and duodenum are decompressed.  Bulky densely calcified gallstone in the gallbladder measuring 3-4 cm. No definite pericholecystic inflammation. Negative non contrast liver, spleen, pancreas, and adrenal glands (adrenal hyperplasia). No hydronephrosis. Low-density area in the left renal midpole with simple fluid densitometry (e.g. cyst). No pneumoperitoneum identified.  IMPRESSION: 1. NG tube terminates within the proximal third of the stomach. The transverse colon is mildly redundant and extends caudally in the midline all  moles to the umbilicus. 2. Bilateral lower lobe consolidation/pneumonia. Perhaps trace superimposed pleural effusions. 3. Bulky gallstone within the gallbladder. 4. Linear foreign body in the lumbar thecal sac extending from L3-L4 to L5-S1.   Electronically Signed   By: Augusto GambleLee  Hall M.D.   On: 07/05/2014 23:18   Ct Head Wo  Contrast  06/22/2014   CLINICAL DATA:  73 year old female with altered mental status. Unresponsive patient.  EXAM: CT HEAD WITHOUT CONTRAST  TECHNIQUE: Contiguous axial images were obtained from the base of the skull through the vertex without intravenous contrast.  COMPARISON:  Multiple priors, most recently 06/16/2014.  FINDINGS: Again noted is severe cerebral and cerebellar atrophy with some ex vacuo dilatation of the ventricular system. However, there is also rounding of the temporal horns, rounding of the third ventricle and dilatation of the fourth ventricle, all of which appears chronic compared to prior studies, and are suggestive of underlying chronic communicating hydrocephalus. Patchy and confluent areas of decreased attenuation are noted throughout the deep and periventricular white matter of the cerebral hemispheres bilaterally, compatible with chronic microvascular ischemic disease. No acute intracranial abnormalities. Specifically, no evidence of acute/subacute ischemia, no acute intracranial hemorrhage, no mass, mass effect or other abnormal intra or extra-axial fluid collections. No acute displaced skull fractures are identified. Mastoids are well pneumatized bilaterally. Multifocal mucosal thickening in the ethmoid sinuses bilaterally, frontal sinuses bilaterally, and some opacification of the left frontal sinus and frontoethmoidal recess, similar to the prior examination. 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  IMPRESSION: 1. No acute findings. 2. 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 3. Paranasal sinus disease, as above, similar to prior examination.   Electronically Signed   By: Trudie Reedaniel  Entrikin M.D.   On: 06/22/2014 13:19   Dg Chest Port 1 View  07/04/2014   CLINICAL DATA:  Status post  tracheostomy tube placement.  EXAM: PORTABLE CHEST - 1 VIEW  COMPARISON:  Single view of the chest 07/01/2014.  FINDINGS: Endotracheal tube has been exchange for a new tracheostomy tube which projects in good position. NG tube courses into the stomach and below the inferior margin of the film. Left PICC is now identified with the tip in the lower superior vena cava. Right PICC has been removed.  There has been some increase in basilar airspace disease, worse on the left. No pneumothorax. Heart size normal.  IMPRESSION: Tracheostomy tube projects in good position.  New left PICC projects with its tip in the lower superior vena cava.  Increased left greater than right basilar airspace disease, likely atelectasis.   Electronically Signed   By: Drusilla Kannerhomas  Dalessio M.D.   On: 07/04/2014 19:02   Dg Chest Port 1 View  07/01/2014   CLINICAL DATA:  Fever.  EXAM: PORTABLE CHEST - 1 VIEW  COMPARISON:  Single view of the chest 06/27/2014.  FINDINGS: NG tube courses into the stomach and below the inferior margin of film. Right PICC and ET tube remain in place in good position. Lungs appear clear. No pneumothorax or pleural effusion. Heart size is normal.  IMPRESSION: Support apparatus is unchanged.  No acute disease.   Electronically Signed   By: Drusilla Kannerhomas  Dalessio M.D.   On: 07/01/2014 18:22   Dg Chest Port 1 View  06/28/2014   CLINICAL DATA:  Shortness of breath.  Evaluate for aspiration.  EXAM: PORTABLE CHEST - 1  VIEW  COMPARISON:  06/21/2014; 06/19/2014  FINDINGS: Grossly unchanged cardiac silhouette and mediastinal contours. Interval removal to. Otherwise, stable position of remaining support apparatus. No definite pneumothorax, though note, evaluation of the right lung apex is obscured secondary to the patient's overlying chin. Minimally improved aeration the lungs with persistent bibasilar opacities, left greater than right. Trace left-sided effusion is not excluded. No evidence of edema. No acute osseus abnormalities.   IMPRESSION: 1. Interval removal of enteric tube. Otherwise, stable positioning of remaining support apparatus. No definite pneumothorax. 2. Improved aeration the lungs with persistent bibasilar opacities, left greater than right, atelectasis versus infiltrate. No new focal airspace opacities.   Electronically Signed   By: Simonne ComeJohn  Watts M.D.   On: 06/28/2014 02:38   Dg Chest Port 1 View  06/21/2014   CLINICAL DATA:  Respiratory failure. Hypertension. Diabetes. Dementia.  EXAM: PORTABLE CHEST - 1 VIEW  COMPARISON:  06/19/2014  FINDINGS: The patient is rotated to the Left on today's radiograph, reducing diagnostic sensitivity and specificity. Endotracheal tube is 10 mm above the carina. Consider retracting 1.5 cm.  Continued left basilar airspace opacity with obscuration of left hemidiaphragm. Heart size within normal limits.  Right central line tip: Lower SVC. A nasogastric tube enters the stomach.  IMPRESSION: 1. Stable appearance is chest. The endotracheal tube is only 1 cm above the carina, consider retracting 1.5 cm. 2. Stable left lower lobe airspace opacity, nonspecific, potentially from atelectasis, layering pleural effusion, or pneumonia.   Electronically Signed   By: Herbie BaltimoreWalt  Liebkemann M.D.   On: 06/21/2014 16:42   Dg Abd Portable 1v  07/04/2014   CLINICAL DATA:  73 year old female -NG tube placement.  EXAM: PORTABLE ABDOMEN - 1 VIEW  COMPARISON:  06/28/2014  FINDINGS: An NG tube is identified with tip overlying the mid stomach.  The bowel gas pattern is unremarkable.  Pelvic calcifications again noted probably uterine fibroids.  IMPRESSION: NG tube with tip overlying the mid stomach.   Electronically Signed   By: Laveda AbbeJeff  Hu M.D.   On: 07/04/2014 12:21   Dg Abd Portable 1v  06/28/2014   CLINICAL DATA:  Orogastric tube placement.  EXAM: PORTABLE ABDOMEN - 1 VIEW  COMPARISON:  06/21/2014  FINDINGS: Orogastric tube tip at the distal stomach.  Extensive artifact overlapping the abdomen and lower chest. The  visualized bowel gas pattern is nonobstructive. Lung bases are grossly clear.  IMPRESSION: Orogastric tube tip at the distal stomach.   Electronically Signed   By: Tiburcio PeaJonathan  Watts M.D.   On: 06/28/2014 05:14   Dg Abd Portable 1v  06/21/2014   CLINICAL DATA:  Orogastric tube placement.  Hypertension.  Diabetes.  EXAM: PORTABLE ABDOMEN - 1 VIEW  COMPARISON:  04/27/2014  FINDINGS: Orogastric tube is in the stomach with tip in the stomach body. The stomach appears somewhat distended given the curvature of the tube.  Retrocardiac airspace opacity, retrocardiac airspace opacity.  IMPRESSION: 1. Orogastric tube tip is in the mildly distended stomach body. 2. Left lower lobe airspace opacity.   Electronically Signed   By: Herbie BaltimoreWalt  Liebkemann M.D.   On: 06/21/2014 16:43    Labs:  CBC:  Recent Labs  06/24/14 0500 06/25/14 0500 06/26/14 0500 06/27/14 0522 07/04/14 0515 07/08/14 0800  WBC 7.7 7.1  --   --  11.0* 8.2  HGB 8.8* 8.4* 8.7* 9.1* 8.5* 8.9*  HCT 25.7* 24.2* 25.2* 26.5* 24.7* 25.9*  PLT 374 365  --   --  606* 506*    COAGS:  Recent  Labs  07/06/14 0803  INR 1.28    BMP:  Recent Labs  06/25/14 0500 06/26/14 0500 06/27/14 0522 07/04/14 0515  NA 137 138 134* 137  K 3.5* 3.5* 4.9 4.3  CL 97 99 95* 98  CO2 33* 30 29 29   GLUCOSE 286* 310* 282* 48*  BUN 14 10 11 16   CALCIUM 8.8 8.0* 8.8 9.6  CREATININE 0.65 0.59 0.67 0.70  GFRNONAA 85* 87* 84* 83*  GFRAA >90 >90 >90 >90    LIVER FUNCTION TESTS:  Recent Labs  06/21/14 1539  BILITOT <0.2*  AST 27  ALT 19  ALKPHOS 71  PROT 5.7*  ALBUMIN 2.3*    TUMOR MARKERS: No results found for this basename: AFPTM, CEA, CA199, CHROMGRNA,  in the last 8760 hours  Assessment and Plan: Palmyra D Giuffre is a 73 y.o. female who was admitted to [facility] and intubated for status epilepticus. She failed extubation twice while there, and was transferred to [facility] for further evaluation. A tracheostomy was placed on 07/04/14. She also has  history of anoxic brain injury/moderate dementia, HTN, DM, prior c diff colitis- currently neg, MRSA PNA (on vancomycin), recurrent UTI,  malnutrition/dysphagia- receiving feeds via NG.  Request has been received for percutaneous gastrostomy tube placement. Imaging studies have been reviewed by Dr. Archer Asa and pt is candidate for perc G tube placement. Details/risks of procedure d/w pt's son/POA , Macky Lower, with his understanding and consent. Tent plan is for procedure to be done 10/26.        I spent a total of 40 minutes face to face in clinical consultation, greater than 50% of which was counseling/coordinating care for percutaneous gastrostomy tube.  Signed: Chinita Pester 07/08/2014, 11:57 AM

## 2014-07-09 LAB — CBC
HCT: 24.8 % — ABNORMAL LOW (ref 36.0–46.0)
HEMOGLOBIN: 8.4 g/dL — AB (ref 12.0–15.0)
MCH: 23.3 pg — ABNORMAL LOW (ref 26.0–34.0)
MCHC: 33.9 g/dL (ref 30.0–36.0)
MCV: 68.9 fL — ABNORMAL LOW (ref 78.0–100.0)
Platelets: 535 10*3/uL — ABNORMAL HIGH (ref 150–400)
RBC: 3.6 MIL/uL — AB (ref 3.87–5.11)
RDW: 15.9 % — ABNORMAL HIGH (ref 11.5–15.5)
WBC: 9.3 10*3/uL (ref 4.0–10.5)

## 2014-07-09 LAB — BASIC METABOLIC PANEL
ANION GAP: 11 (ref 5–15)
BUN: 15 mg/dL (ref 6–23)
CHLORIDE: 96 meq/L (ref 96–112)
CO2: 28 meq/L (ref 19–32)
Calcium: 9.6 mg/dL (ref 8.4–10.5)
Creatinine, Ser: 0.66 mg/dL (ref 0.50–1.10)
GFR calc Af Amer: >90 mL/min (ref >90)
GFR calc non Af Amer: 84 mL/min — ABNORMAL LOW (ref >90)
Glucose, Bld: 237 mg/dL — ABNORMAL HIGH (ref 70–99)
POTASSIUM: 4.4 meq/L (ref 3.7–5.3)
Sodium: 135 mEq/L — ABNORMAL LOW (ref 137–147)

## 2014-07-10 ENCOUNTER — Other Ambulatory Visit (HOSPITAL_COMMUNITY): Payer: Medicaid Other

## 2014-07-10 DIAGNOSIS — J9600 Acute respiratory failure, unspecified whether with hypoxia or hypercapnia: Secondary | ICD-10-CM | POA: Diagnosis not present

## 2014-07-10 DIAGNOSIS — T17998A Other foreign object in respiratory tract, part unspecified causing other injury, initial encounter: Secondary | ICD-10-CM | POA: Diagnosis not present

## 2014-07-10 DIAGNOSIS — J9601 Acute respiratory failure with hypoxia: Secondary | ICD-10-CM | POA: Diagnosis not present

## 2014-07-10 LAB — BASIC METABOLIC PANEL
ANION GAP: 10 (ref 5–15)
BUN: 17 mg/dL (ref 6–23)
CHLORIDE: 100 meq/L (ref 96–112)
CO2: 28 meq/L (ref 19–32)
CREATININE: 0.74 mg/dL (ref 0.50–1.10)
Calcium: 9.9 mg/dL (ref 8.4–10.5)
GFR calc Af Amer: >90 mL/min (ref >90)
GFR calc non Af Amer: 81 mL/min — ABNORMAL LOW (ref >90)
GLUCOSE: 69 mg/dL — AB (ref 70–99)
Potassium: 4.2 mEq/L (ref 3.7–5.3)
Sodium: 138 mEq/L (ref 137–147)

## 2014-07-10 LAB — PROTIME-INR
INR: 1.27 (ref 0.00–1.49)
Prothrombin Time: 16.1 seconds — ABNORMAL HIGH (ref 11.6–15.2)

## 2014-07-10 LAB — CBC
HEMATOCRIT: 25.4 % — AB (ref 36.0–46.0)
Hemoglobin: 8.8 g/dL — ABNORMAL LOW (ref 12.0–15.0)
MCH: 23.8 pg — ABNORMAL LOW (ref 26.0–34.0)
MCHC: 34.6 g/dL (ref 30.0–36.0)
MCV: 68.8 fL — AB (ref 78.0–100.0)
Platelets: 600 10*3/uL — ABNORMAL HIGH (ref 150–400)
RBC: 3.69 MIL/uL — AB (ref 3.87–5.11)
RDW: 16.1 % — ABNORMAL HIGH (ref 11.5–15.5)
WBC: 10.8 10*3/uL — ABNORMAL HIGH (ref 4.0–10.5)

## 2014-07-10 LAB — APTT: APTT: 30 s (ref 24–37)

## 2014-07-10 NOTE — Consult Note (Signed)
Reason for consult: gastrostomy tube placement  Referring Physician(s): Dr. Sharyon MedicusHijazi  History of Present Illness: Michele Guerra is a 73 y.o. female who [facility] and intubated for status epilepticus. She failed extubation twice while there, and [facility]. A tracheostomy was placed on 07/04/14. She also has history of anoxic brain injury/moderate dementia, HTN, DM, prior c diff colitis- currently neg, MRSA PNA (on vancomycin), recurrent UTI,  malnutrition/dysphagia- receiving feeds via NG.  Request has been received for percutaneous gastrostomy tube placement.      Past Medical History  Diagnosis Date  . Dementia     Reportedly Severe  . Dysphagia   . HTN (hypertension)   . Diabetes mellitus   . Recurrent UTI   . Hyperthyroidism     Past Surgical History  Procedure Laterality Date  . Tracheostomy tube placement N/A 07/04/2014    Procedure: TRACHEOSTOMY;  Surgeon: Drema Halonhristopher E Newman, MD;  [facility];  Service: ENT;  Laterality: N/A;    Allergies: Review of patient's allergies indicates not on file.  Medications: Prior to Admission medications   Not on File    No family history on file.  History   Social History  . Marital Status: Single    Spouse Name: N/A    Number of Children: N/A  . Years of Education: N/A   Social History Main Topics  . Smoking status: None  . Smokeless tobacco: None  . Alcohol Use: None  . Drug Use: None  . Sexual Activity: None   Other Topics Concern  . None   Social History Narrative  . None         Review of Systems unable to obtain due to clinical state  Vital Signs: BP 124/62  HR 77  R 16  TEMP 98.3  O2 SATS 100% (TRACH) Physical Exam  Pt not F/C; will occ open eyes per nurse; NG in place; chest- dim BS bases; heart- RRR; abd- soft,+BS,NT; ext- no sig edema, both feet in boots  Imaging: Ct Abdomen Wo Contrast  07/05/2014   CLINICAL DATA:  73 year old female  with malnutrition and Clostridium difficile colitis. Percutaneous gastrostomy tube placement requested. Initial encounter.  EXAM: CT ABDOMEN WITHOUT CONTRAST  TECHNIQUE: Multidetector CT imaging of the abdomen was performed following the standard protocol without IV contrast.  COMPARISON:  Abdomen radiographs 07/04/2014 and earlier.  FINDINGS: Bilateral lower lobe consolidation with air bronchograms. Suspect small volume of superimposed bilateral pleural fluid. No pericardial effusion.  No acute osseous abnormality identified. Mild chronic appearing superior endplate fracture of T12. Chronic appearing bulky L3 superior endplate Schmorl's node and/or fracture. There is a linear radiopaque object within the lumbar thecal sac extending from the L3-L4 to the L5-S1 level (see sagittal image 73).  Fluid and/or dilute contrast in the colon. The transverse colon is mildly redundant and extends down almost to the level of the umbilicus. No colonic wall thickening identified. No dilated small bowel. NG tube in place, terminates at the proximal third of the stomach. The stomach and duodenum are decompressed.  Bulky densely calcified gallstone in the gallbladder measuring 3-4 cm. No definite pericholecystic inflammation. Negative non contrast liver, spleen, pancreas, and adrenal glands (adrenal hyperplasia). No hydronephrosis. Low-density area in the left renal midpole with simple fluid densitometry (e.g. cyst). No pneumoperitoneum identified.  IMPRESSION: 1. NG tube terminates within the proximal third of the stomach. The transverse colon is mildly redundant and extends caudally in the midline all  moles to the umbilicus. 2. Bilateral lower lobe consolidation/pneumonia. Perhaps trace superimposed pleural effusions. 3. Bulky gallstone within the gallbladder. 4. Linear foreign body in the lumbar thecal sac extending from L3-L4 to L5-S1.   Electronically Signed   By: Augusto GambleLee  Hall M.D.   On: 07/05/2014 23:18   Ct Head Wo  Contrast  06/22/2014   CLINICAL DATA:  73 year old female with altered mental status. Unresponsive patient.  EXAM: CT HEAD WITHOUT CONTRAST  TECHNIQUE: Contiguous axial images were obtained from the base of the skull through the vertex without intravenous contrast.  COMPARISON:  Multiple priors, most recently 06/16/2014.  FINDINGS: Again noted is severe cerebral and cerebellar atrophy with some ex vacuo dilatation of the ventricular system. However, there is also rounding of the temporal horns, rounding of the third ventricle and dilatation of the fourth ventricle, all of which appears chronic compared to prior studies, and are suggestive of underlying chronic communicating hydrocephalus. Patchy and confluent areas of decreased attenuation are noted throughout the deep and periventricular white matter of the cerebral hemispheres bilaterally, compatible with chronic microvascular ischemic disease. No acute intracranial abnormalities. Specifically, no evidence of acute/subacute ischemia, no acute intracranial hemorrhage, no mass, mass effect or other abnormal intra or extra-axial fluid collections. No acute displaced skull fractures are identified. Mastoids are well pneumatized bilaterally. Multifocal mucosal thickening in the ethmoid sinuses bilaterally, frontal sinuses bilaterally, and some opacification of the left frontal sinus and frontoethmoidal recess, similar to the prior examination. 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  IMPRESSION: 1. No acute findings. 2. 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 3. Paranasal sinus disease, as above, similar to prior examination.   Electronically Signed   By: Trudie Reedaniel  Entrikin M.D.   On: 06/22/2014 13:19   Dg Chest Port 1 View  07/04/2014   CLINICAL DATA:  Status post  tracheostomy tube placement.  EXAM: PORTABLE CHEST - 1 VIEW  COMPARISON:  Single view of the chest 07/01/2014.  FINDINGS: Endotracheal tube has been exchange for a new tracheostomy tube which projects in good position. NG tube courses into the stomach and below the inferior margin of the film. Left PICC is now identified with the tip in the lower superior vena cava. Right PICC has been removed.  There has been some increase in basilar airspace disease, worse on the left. No pneumothorax. Heart size normal.  IMPRESSION: Tracheostomy tube projects in good position.  New left PICC projects with its tip in the lower superior vena cava.  Increased left greater than right basilar airspace disease, likely atelectasis.   Electronically Signed   By: Drusilla Kannerhomas  Dalessio M.D.   On: 07/04/2014 19:02   Dg Chest Port 1 View  07/01/2014   CLINICAL DATA:  Fever.  EXAM: PORTABLE CHEST - 1 VIEW  COMPARISON:  Single view of the chest 06/27/2014.  FINDINGS: NG tube courses into the stomach and below the inferior margin of film. Right PICC and ET tube remain in place in good position. Lungs appear clear. No pneumothorax or pleural effusion. Heart size is normal.  IMPRESSION: Support apparatus is unchanged.  No acute disease.   Electronically Signed   By: Drusilla Kannerhomas  Dalessio M.D.   On: 07/01/2014 18:22   Dg Chest Port 1 View  06/28/2014   CLINICAL DATA:  Shortness of breath.  Evaluate for aspiration.  EXAM: PORTABLE CHEST - 1  VIEW  COMPARISON:  06/21/2014; 06/19/2014  FINDINGS: Grossly unchanged cardiac silhouette and mediastinal contours. Interval removal to. Otherwise, stable position of remaining support apparatus. No definite pneumothorax, though note, evaluation of the right lung apex is obscured secondary to the patient's overlying chin. Minimally improved aeration the lungs with persistent bibasilar opacities, left greater than right. Trace left-sided effusion is not excluded. No evidence of edema. No acute osseus abnormalities.   IMPRESSION: 1. Interval removal of enteric tube. Otherwise, stable positioning of remaining support apparatus. No definite pneumothorax. 2. Improved aeration the lungs with persistent bibasilar opacities, left greater than right, atelectasis versus infiltrate. No new focal airspace opacities.   Electronically Signed   By: Simonne ComeJohn  Watts M.D.   On: 06/28/2014 02:38   Dg Chest Port 1 View  06/21/2014   CLINICAL DATA:  Respiratory failure. Hypertension. Diabetes. Dementia.  EXAM: PORTABLE CHEST - 1 VIEW  COMPARISON:  06/19/2014  FINDINGS: The patient is rotated to the Left on today's radiograph, reducing diagnostic sensitivity and specificity. Endotracheal tube is 10 mm above the carina. Consider retracting 1.5 cm.  Continued left basilar airspace opacity with obscuration of left hemidiaphragm. Heart size within normal limits.  Right central line tip: Lower SVC. A nasogastric tube enters the stomach.  IMPRESSION: 1. Stable appearance is chest. The endotracheal tube is only 1 cm above the carina, consider retracting 1.5 cm. 2. Stable left lower lobe airspace opacity, nonspecific, potentially from atelectasis, layering pleural effusion, or pneumonia.   Electronically Signed   By: Herbie BaltimoreWalt  Liebkemann M.D.   On: 06/21/2014 16:42   Dg Abd Portable 1v  07/04/2014   CLINICAL DATA:  73 year old female -NG tube placement.  EXAM: PORTABLE ABDOMEN - 1 VIEW  COMPARISON:  06/28/2014  FINDINGS: An NG tube is identified with tip overlying the mid stomach.  The bowel gas pattern is unremarkable.  Pelvic calcifications again noted probably uterine fibroids.  IMPRESSION: NG tube with tip overlying the mid stomach.   Electronically Signed   By: Laveda AbbeJeff  Hu M.D.   On: 07/04/2014 12:21   Dg Abd Portable 1v  06/28/2014   CLINICAL DATA:  Orogastric tube placement.  EXAM: PORTABLE ABDOMEN - 1 VIEW  COMPARISON:  06/21/2014  FINDINGS: Orogastric tube tip at the distal stomach.  Extensive artifact overlapping the abdomen and lower chest. The  visualized bowel gas pattern is nonobstructive. Lung bases are grossly clear.  IMPRESSION: Orogastric tube tip at the distal stomach.   Electronically Signed   By: Tiburcio PeaJonathan  Watts M.D.   On: 06/28/2014 05:14   Dg Abd Portable 1v  06/21/2014   CLINICAL DATA:  Orogastric tube placement.  Hypertension.  Diabetes.  EXAM: PORTABLE ABDOMEN - 1 VIEW  COMPARISON:  04/27/2014  FINDINGS: Orogastric tube is in the stomach with tip in the stomach body. The stomach appears somewhat distended given the curvature of the tube.  Retrocardiac airspace opacity, retrocardiac airspace opacity.  IMPRESSION: 1. Orogastric tube tip is in the mildly distended stomach body. 2. Left lower lobe airspace opacity.   Electronically Signed   By: Herbie BaltimoreWalt  Liebkemann M.D.   On: 06/21/2014 16:43    Labs:  CBC:  Recent Labs  06/24/14 0500 06/25/14 0500 06/26/14 0500 06/27/14 0522 07/04/14 0515 07/08/14 0800  WBC 7.7 7.1  --   --  11.0* 8.2  HGB 8.8* 8.4* 8.7* 9.1* 8.5* 8.9*  HCT 25.7* 24.2* 25.2* 26.5* 24.7* 25.9*  PLT 374 365  --   --  606* 506*    COAGS:  Recent  Labs  07/06/14 0803  INR 1.28    BMP:  Recent Labs  06/25/14 0500 06/26/14 0500 06/27/14 0522 07/04/14 0515  NA 137 138 134* 137  K 3.5* 3.5* 4.9 4.3  CL 97 99 95* 98  CO2 33* 30 29 29   GLUCOSE 286* 310* 282* 48*  BUN 14 10 11 16   CALCIUM 8.8 8.0* 8.8 9.6  CREATININE 0.65 0.59 0.67 0.70  GFRNONAA 85* 87* 84* 83*  GFRAA >90 >90 >90 >90    LIVER FUNCTION TESTS:  Recent Labs  06/21/14 1539  BILITOT <0.2*  AST 27  ALT 19  ALKPHOS 71  PROT 5.7*  ALBUMIN 2.3*    TUMOR MARKERS: No results found for this basename: AFPTM, CEA, CA199, CHROMGRNA,  in the last 8760 hours  Assessment and Plan: Michele Guerra is a 73 y.o. female who [facility] and [facility] for status epilepticus. She failed extubation twice while there, and was [facility]. A tracheostomy was placed on 07/04/14. She also has  history of anoxic brain injury/moderate dementia, HTN, DM, prior c diff colitis- currently neg, MRSA PNA (on vancomycin), recurrent UTI,  malnutrition/dysphagia- receiving feeds via NG.  Request has been received for percutaneous gastrostomy tube placement. Imaging studies have been reviewed by Dr. Archer AsaMcCullough and pt is candidate for perc G tube placement. Details/risks of procedure d/w pt's son/POA , [name], with his understanding and consent. Tent plan is for procedure to be done 10/26.        I spent a total of 40 minutes face to face in clinical consultation, greater than 50% of which was counseling/coordinating care for percutaneous gastrostomy tube.  Signed: Chinita PesterALLRED,D KEVIN 07/08/2014, 11:57 AM  Agree with PA note above.  Signed,  Sterling BigHeath K. McCullough, MD

## 2014-07-10 NOTE — Progress Notes (Signed)
Name: Michele Guerra MRN: [id number] DOB: 03/06/1941    ADMISSION DATE:  06/21/2014 CONSULTATION DATE:  06/21/2014  REFERRING MD :  Dr. Sharyon MedicusHijazi  CHIEF COMPLAINT:  VDRF  BRIEF PATIENT DESCRIPTION: 73 year old [facility] and intubated for status epilepticus. She failed extubation twice while there, and [facility] PCCM contacted for ventilator assistance.   SIGNIFICANT EVENTS  9/29 - [facility]> intubated 10/2 - failed extubation 10/5 - began following commands, failed extubation.  [facility]7 [facility]: vent mechanics look good but still not awake enough to protect airway.  10/9 apneic during waning trials. No sedation over night. 10/12 currently on full vent support. 10/13 family does not want trach. 10/14 still not awake enough to extubate 10/15 not awake. No family decision concerning terminal extubation 10/20: trach by ENT   STUDIES:  10/2 CT head > no acute intracranial abnormality, chronic vascular changes and atrophy.   LINES/TUBES OETT 9/29 >>>10/20 trach (newman)  PICC 10/2 >>>   INTERVAL: Weaning on ATC  VITAL SIGNS: Vital signs reviewed. Abnormal values will appear under impression plan section.   PHYSICAL EXAMINATION: General:  Female of normal body habitus in NAD on ventilator Neuro:  , RASS -2 despite no sedation. , grimaces to pain,does not follow commands HEENT:  [place]/AT, trach in place, No JVD noted, left periorbital; edema  Cardiovascular:  RRR, SEM 2/6 Lungs:  Clear anterior breath sounds. Unlabored, synchronous with vent.  Abdomen:  Soft, non-distended, BS normoactive. TF running Musculoskeletal:  No acute deformity.  Skin:  Intact   Recent Labs Lab 07/04/14 0515 07/09/14 0609 07/10/14 0600  NA 137 135* 138  K 4.3 4.4 4.2  CL 98 96 100  CO2 29 28 28   BUN 16 15 17   CREATININE 0.70 0.66 0.74  GLUCOSE 48* 237* 69*    Recent Labs Lab 07/08/14 0800 07/09/14 0609  07/10/14 0600  HGB 8.9* 8.4* 8.8*  HCT 25.9* 24.8* 25.4*  WBC 8.2 9.3 10.8*  PLT 506* 535* 600*   ABG   Dg Chest Port 1 View  07/10/2014   CLINICAL DATA:  Respiratory failure.  EXAM: PORTABLE CHEST - 1 VIEW  COMPARISON:  07/04/2014.  FINDINGS: Tracheostomy is midline. Nasogastric tube is followed into the stomach. Left PICC tip projects over the SVC. Lungs are somewhat low in volume with mild right infrahilar airspace disease. Minimal subsegmental atelectasis at the left lung base. Overall, bibasilar aeration has improved from 07/04/2014. No definite pleural fluid.  IMPRESSION: Mild residual bibasilar volume loss with overall improvement in aeration from 07/04/2014.   Electronically Signed   By: Leanna BattlesMelinda  Blietz M.D.   On: 07/10/2014 08:07  agree. Mild improvement in aeration c/w prior films   ASSESSMENT / PLAN:  Acute Respiratory Failure s/p seizures HCAP vs aspiration -MRSA 10/17 Moderate dementia -wheelchair bound prior to current illness Anoxic brain injury -was responsive [facility] to [facility] HTN  DM  Cdiff colitis   Discussion   Carries dx of anoxic brain injury. Now s/p trach . Looks comfortable on ATC. Hope that we can liberate her from the vent soon 24/7. Now awaiting PEG for today.   recs - wean as tolerated; fast track wean protocol to ATC as tolerated - VAP prevention per protocol - Abx per primary service  - PRN albuterol - not currently a candidate for decannulation  - we will see weekly   BABCOCK,PETE   I  have fully examined this patient and agree with above findings.     Mcarthur Rossettianiel J. Tyson AliasFeinstein, MD, FACP Pgr: [phone] [facility]

## 2014-07-11 ENCOUNTER — Other Ambulatory Visit (HOSPITAL_COMMUNITY): Payer: Medicaid Other

## 2014-07-11 LAB — VANCOMYCIN, TROUGH: Vancomycin Tr: 15.7 ug/mL (ref 10.0–20.0)

## 2014-07-11 MED ORDER — FENTANYL CITRATE 0.05 MG/ML IJ SOLN
INTRAMUSCULAR | Status: AC | PRN
  Administered 2014-07-11: 25 ug via INTRAVENOUS

## 2014-07-11 MED ORDER — GLUCAGON HCL RDNA (DIAGNOSTIC) 1 MG IJ SOLR
INTRAMUSCULAR | Status: AC
  Filled 2014-07-11: qty 1

## 2014-07-11 MED ORDER — LIDOCAINE HCL 1 % IJ SOLN
INTRAMUSCULAR | Status: AC
  Filled 2014-07-11: qty 20

## 2014-07-11 MED ORDER — MIDAZOLAM HCL 2 MG/2ML IJ SOLN
INTRAMUSCULAR | Status: AC
  Filled 2014-07-11: qty 2

## 2014-07-11 MED ORDER — CEFAZOLIN SODIUM-DEXTROSE 2-3 GM-% IV SOLR
INTRAVENOUS | Status: AC
  Filled 2014-07-11: qty 50

## 2014-07-11 MED ORDER — SODIUM CHLORIDE 0.9 % IV SOLN
INTRAVENOUS | Status: AC | PRN
  Administered 2014-07-11: 10 mL/h via INTRAVENOUS

## 2014-07-11 MED ORDER — IOHEXOL 300 MG/ML  SOLN
50.0000 mL | Freq: Once | INTRAMUSCULAR | Status: AC | PRN
  Administered 2014-07-11: 1 mL via INTRAVENOUS

## 2014-07-11 MED ORDER — FENTANYL CITRATE 0.05 MG/ML IJ SOLN
INTRAMUSCULAR | Status: AC
  Filled 2014-07-11: qty 2

## 2014-07-11 MED ORDER — MIDAZOLAM HCL 2 MG/2ML IJ SOLN
INTRAMUSCULAR | Status: AC | PRN
  Administered 2014-07-11: 1 mg via INTRAVENOUS

## 2014-07-11 MED ORDER — CEFAZOLIN SODIUM-DEXTROSE 2-3 GM-% IV SOLR: 2.0000 g | Freq: Once | INTRAVENOUS | Status: DC

## 2014-07-11 NOTE — Procedures (Signed)
Procedure: Gastrostomy tube placement 20 Fr bumper retention gastrostomy placed with tip in body of stomach. OK to use for feeds in 24 hours.

## 2014-07-12 ENCOUNTER — Other Ambulatory Visit (HOSPITAL_COMMUNITY): Payer: Medicaid Other

## 2014-07-12 LAB — HEMOGLOBIN AND HEMATOCRIT, BLOOD
HCT: 28.6 % — ABNORMAL LOW (ref 36.0–46.0)
HEMOGLOBIN: 9.8 g/dL — AB (ref 12.0–15.0)

## 2014-07-13 LAB — CBC
HCT: 27.4 % — ABNORMAL LOW (ref 36.0–46.0)
Hemoglobin: 9.1 g/dL — ABNORMAL LOW (ref 12.0–15.0)
MCH: 22.8 pg — ABNORMAL LOW (ref 26.0–34.0)
MCHC: 33.2 g/dL (ref 30.0–36.0)
MCV: 68.7 fL — ABNORMAL LOW (ref 78.0–100.0)
Platelets: 594 10*3/uL — ABNORMAL HIGH (ref 150–400)
RBC: 3.99 MIL/uL (ref 3.87–5.11)
RDW: 15.9 % — ABNORMAL HIGH (ref 11.5–15.5)
WBC: 8.8 10*3/uL (ref 4.0–10.5)

## 2014-07-13 LAB — BASIC METABOLIC PANEL
Anion gap: 12 (ref 5–15)
BUN: 12 mg/dL (ref 6–23)
CO2: 26 mEq/L (ref 19–32)
Calcium: 9.8 mg/dL (ref 8.4–10.5)
Chloride: 98 mEq/L (ref 96–112)
Creatinine, Ser: 0.59 mg/dL (ref 0.50–1.10)
GFR calc Af Amer: >90 mL/min (ref >90)
GFR, EST NON AFRICAN AMERICAN: 87 mL/min — AB (ref >90)
Glucose, Bld: 186 mg/dL — ABNORMAL HIGH (ref 70–99)
Potassium: 4.4 mEq/L (ref 3.7–5.3)
SODIUM: 136 meq/L — AB (ref 137–147)

## 2014-07-14 DIAGNOSIS — T17998A Other foreign object in respiratory tract, part unspecified causing other injury, initial encounter: Secondary | ICD-10-CM

## 2014-07-14 DIAGNOSIS — J9601 Acute respiratory failure with hypoxia: Secondary | ICD-10-CM

## 2014-07-14 NOTE — Progress Notes (Signed)
   Name: Candise CheCleatis D Castilleja MRN: [id number] DOB: 03/04/1941    ADMISSION DATE:  06/21/2014 CONSULTATION DATE:  06/21/2014  REFERRING MD :  Dr. Sharyon MedicusHijazi  CHIEF COMPLAINT:  VDRF  BRIEF PATIENT DESCRIPTION: 73 [facility] and [facility] for status epilepticus. She failed extubation twice while there, and [facility] PCCM contacted for ventilator assistance.   SIGNIFICANT EVENTS  9[facility]29 - [facility]> intubated 10/2 - failed extubation 10/5 - began following commands, failed extubation.  [facility] 10[facility]8: vent mechanics look good but still not awake enough to protect airway.  10/9 apneic during waning trials. No sedation over night. 10/12 currently on full vent support. 10/13 family does not want trach. 10/14 still not awake enough to extubate 10/15 not awake. No family decision concerning terminal extubation 10/20: trach by ENT   STUDIES:  10/2 CT head > no acute intracranial abnormality, chronic vascular changes and atrophy.   LINES/TUBES OETT 9/29 >>>10/20 trach (newman)  PICC 10/2 >>>   INTERVAL:of vent now 3 days  VITAL SIGNS: Vital signs reviewed. Abnormal values will appear under impression plan section.   PHYSICAL EXAMINATION: General:  No distress Neuro:  , RASS -1, more awake HEENT:  Trach clean  Cardiovascular:  RRR, SEM 2/6 Lungs:  CTA anterior Abdomen:  Soft, non-distended, BS normoactive Musculoskeletal:  No acute deformity.  Skin:  Intact   Recent Labs Lab 07/09/14 0609 07/10/14 0600 07/13/14 0500  NA 135* 138 136*  K 4.4 4.2 4.4  CL 96 100 98  CO2 28 28 26   BUN 15 17 12   CREATININE 0.66 0.74 0.59  GLUCOSE 237* 69* 186*    Recent Labs Lab 07/09/14 0609 07/10/14 0600 07/12/14 0810 07/13/14 0500  HGB 8.4* 8.8* 9.8* 9.1*  HCT 24.8* 25.4* 28.6* 27.4*  WBC 9.3 10.8*  --  8.8  PLT 535* 600*  --  594*   ABG   Dg Chest Port 1 View  07/12/2014   CLINICAL DATA:   Respiratory failure, cough  EXAM: PORTABLE CHEST - 1 VIEW  COMPARISON:  07/10/2014  FINDINGS: Cardiac shadow is stable. A left-sided PICC line and tracheostomy tube are stable. The nasogastric catheter has been removed in the interval. Very minimal left basilar atelectasis is noted. No focal effusion or pneumothorax is noted.  IMPRESSION: Mild left basilar atelectasis.   Electronically Signed   By: Alcide CleverMark  Lukens M.D.   On: 07/12/2014 15:29  agree. Mild improvement in aeration c/w prior films   ASSESSMENT / PLAN:  Acute Respiratory Failure s/p seizures HCAP vs aspiration -MRSA 10/17 Moderate dementia -wheelchair bound prior to current illness Anoxic brain injury -was responsive prior to [facility] per [facility] HTN  DM  Cdiff colitis   Discussion   Carries dx of anoxic brain injury. Now s/p trach . Looks comfortable on ATC. Hope that we can liberate her from the vent soon 24/7. Now awaiting PEG for today.   recs -limited secretions, female, off vent x 3 days = downsize to 4 cuffless is recommended -no cap , not much benefit as of now as no decannulation until improved strength, mobility, neuro status -attempt PMV -will discuss SLP evalu if 4 cuffless placed  Twilia Yaklin J.   I have fully examined this patient and agree with above findings.     Mcarthur Rossettianiel J. Tyson AliasFeinstein, MD, FACP Pgr: [phone] [facility]

## 2014-07-16 ENCOUNTER — Ambulatory Visit: Payer: Self-pay | Admitting: Internal Medicine

## 2014-07-17 ENCOUNTER — Other Ambulatory Visit (HOSPITAL_COMMUNITY): Payer: Medicaid Other

## 2014-07-17 DIAGNOSIS — J9601 Acute respiratory failure with hypoxia: Secondary | ICD-10-CM | POA: Diagnosis not present

## 2014-07-17 LAB — CBC
HCT: 28.7 % — ABNORMAL LOW (ref 36.0–46.0)
HEMOGLOBIN: 9.7 g/dL — AB (ref 12.0–15.0)
MCH: 23.7 pg — ABNORMAL LOW (ref 26.0–34.0)
MCHC: 33.8 g/dL (ref 30.0–36.0)
MCV: 70 fL — ABNORMAL LOW (ref 78.0–100.0)
PLATELETS: 622 10*3/uL — AB (ref 150–400)
RBC: 4.1 MIL/uL (ref 3.87–5.11)
RDW: 16.4 % — ABNORMAL HIGH (ref 11.5–15.5)
WBC: 17.9 10*3/uL — AB (ref 4.0–10.5)

## 2014-07-17 LAB — BASIC METABOLIC PANEL
ANION GAP: 15 (ref 5–15)
BUN: 37 mg/dL — ABNORMAL HIGH (ref 6–23)
CO2: 25 mEq/L (ref 19–32)
Calcium: 10.1 mg/dL (ref 8.4–10.5)
Chloride: 93 mEq/L — ABNORMAL LOW (ref 96–112)
Creatinine, Ser: 1.06 mg/dL (ref 0.50–1.10)
GFR calc Af Amer: 58 mL/min — ABNORMAL LOW (ref >90)
GFR calc non Af Amer: 50 mL/min — ABNORMAL LOW (ref >90)
GLUCOSE: 326 mg/dL — AB (ref 70–99)
Potassium: 5 mEq/L (ref 3.7–5.3)
SODIUM: 133 meq/L — AB (ref 137–147)

## 2014-07-17 NOTE — Progress Notes (Signed)
   Name: Michele Guerra MRN: [id number] DOB: 04/11/1941    ADMISSION DATE:  06/21/2014 CONSULTATION DATE:  06/21/2014  REFERRING MD :  Dr. Sharyon MedicusHijazi  CHIEF COMPLAINT:  VDRF  BRIEF PATIENT DESCRIPTION: 73 year old [facility] and intubated for status epilepticus. She failed extubation twice while there, and [facility] PCCM contacted for ventilator assistance.   SIGNIFICANT EVENTS  9/29 - [facility]> intubated 10/2 - failed extubation 10/5 - began following commands, failed extubation.  10/7 [facility] 10[facility]8: vent mechanics look good but still not awake enough to protect airway.  10/9 apneic during waning trials. No sedation over night. 10/12 currently on full vent support. 10/13 family does not want trach. 10/14 still not awake enough to extubate 10/15 not awake. No family decision concerning terminal extubation 10/20: trach by ENT   STUDIES:  10/2 CT head > no acute intracranial abnormality, chronic vascular changes and atrophy.   LINES/TUBES OETT 9/29 >>>10/20 trach (newman)  PICC 10/2 >>>   INTERVAL: OFF Vent over one week  VITAL SIGNS:  R 90, RR 18, BP 106/58, O2 sat 99%, Temp 99  PHYSICAL EXAMINATION: General:  No distress Neuro: very somnolent on my exam, stirs to touch but doesn't open eyes HEENT: NCAT , trach c/d/i PULM: CTA B CV: RRR, no mgr AB: BS+, soft Ext: warm, trace edema, left hand contracture   Recent Labs Lab 07/13/14 0500 07/17/14 0616  NA 136* 133*  K 4.4 5.0  CL 98 93*  CO2 26 25  BUN 12 37*  CREATININE 0.59 1.06  GLUCOSE 186* 326*    Recent Labs Lab 07/12/14 0810 07/13/14 0500 07/17/14 0616  HGB 9.8* 9.1* 9.7*  HCT 28.6* 27.4* 28.7*  WBC  --  8.8 17.9*  PLT  --  594* 622*   ABG   Dg Chest Port 1 View  07/17/2014   CLINICAL DATA:  73 year old female with respiratory failure  EXAM: PORTABLE CHEST - 1 VIEW  COMPARISON:  Most recent prior chest x-ray  07/12/2014  FINDINGS: Stable position of the tracheostomy tube. The tip is midline and at the level of the clavicles. Left upper extremity PICC. Catheter tip at the superior cavoatrial junction. Stable cardiac and mediastinal contours. Persistent low inspiratory volumes with patchy left basilar opacity which may reflect atelectasis or infiltrate. No pneumothorax or pulmonary edema. Osseous structures are intact and unremarkable.  IMPRESSION: 1. Persistent low inspiratory volumes with left basilar atelectasis and/or infiltrate. 2. Stable and satisfactory support apparatus.   Electronically Signed   By: Malachy MoanHeath  McCullough M.D.   On: 07/17/2014 07:44  agree. Mild improvement in aeration c/w prior films   ASSESSMENT / PLAN:  Acute Respiratory Failure s/p seizures > resolved HCAP vs aspiration -MRSA 10/17 Moderate dementia -wheelchair bound prior to current illness Anoxic brain injury -was responsive prior to [facility] per [facility] HTN  DM  Cdiff colitis   Discussion   Carries dx of anoxic brain injury. Now s/p trach . Comfortable on ATC. Liberated from vent  recs -downsize to 4 cuffless is recommended -no decannulation until improved strength, mobility, neuro status -attempt PMV -will discuss SLP evalu if 4 cuffless placed  PCCM to sign off, call if questions  Heber CarolinaBrent McQuaid, MD [facility] [facility] Pager: [phone] Cell: [phone] If no response, call [phone]

## 2014-07-18 LAB — CBC
HEMATOCRIT: 25.6 % — AB (ref 36.0–46.0)
Hemoglobin: 8.7 g/dL — ABNORMAL LOW (ref 12.0–15.0)
MCH: 23.3 pg — ABNORMAL LOW (ref 26.0–34.0)
MCHC: 34 g/dL (ref 30.0–36.0)
MCV: 68.4 fL — ABNORMAL LOW (ref 78.0–100.0)
Platelets: 584 10*3/uL — ABNORMAL HIGH (ref 150–400)
RBC: 3.74 MIL/uL — ABNORMAL LOW (ref 3.87–5.11)
RDW: 16.3 % — ABNORMAL HIGH (ref 11.5–15.5)
WBC: 11.1 10*3/uL — AB (ref 4.0–10.5)

## 2014-07-18 LAB — URINALYSIS, ROUTINE W REFLEX MICROSCOPIC
BILIRUBIN URINE: NEGATIVE
Glucose, UA: NEGATIVE mg/dL
HGB URINE DIPSTICK: NEGATIVE
Ketones, ur: NEGATIVE mg/dL
Nitrite: NEGATIVE
PH: 7 (ref 5.0–8.0)
Protein, ur: 30 mg/dL — AB
Specific Gravity, Urine: 1.02 (ref 1.005–1.030)
Urobilinogen, UA: 0.2 mg/dL (ref 0.0–1.0)

## 2014-07-18 LAB — URINE MICROSCOPIC-ADD ON

## 2014-07-19 LAB — CBC WITH DIFFERENTIAL/PLATELET
Basophils Absolute: 0 10*3/uL (ref 0.0–0.1)
Basophils Relative: 0 % (ref 0–1)
EOS PCT: 4 % (ref 0–5)
Eosinophils Absolute: 0.5 10*3/uL (ref 0.0–0.7)
HEMATOCRIT: 25.1 % — AB (ref 36.0–46.0)
HEMOGLOBIN: 8.6 g/dL — AB (ref 12.0–15.0)
LYMPHS ABS: 2.3 10*3/uL (ref 0.7–4.0)
Lymphocytes Relative: 20 % (ref 12–46)
MCH: 23.6 pg — ABNORMAL LOW (ref 26.0–34.0)
MCHC: 34.3 g/dL (ref 30.0–36.0)
MCV: 68.8 fL — ABNORMAL LOW (ref 78.0–100.0)
MONOS PCT: 12 % (ref 3–12)
Monocytes Absolute: 1.4 10*3/uL — ABNORMAL HIGH (ref 0.1–1.0)
NEUTROS ABS: 7.2 10*3/uL (ref 1.7–7.7)
Neutrophils Relative %: 64 % (ref 43–77)
Platelets: 604 10*3/uL — ABNORMAL HIGH (ref 150–400)
RBC: 3.65 MIL/uL — AB (ref 3.87–5.11)
RDW: 16.3 % — ABNORMAL HIGH (ref 11.5–15.5)
WBC: 11.4 10*3/uL — ABNORMAL HIGH (ref 4.0–10.5)

## 2014-07-19 LAB — BASIC METABOLIC PANEL
Anion gap: 11 (ref 5–15)
BUN: 25 mg/dL — ABNORMAL HIGH (ref 6–23)
CHLORIDE: 96 meq/L (ref 96–112)
CO2: 28 mEq/L (ref 19–32)
Calcium: 10 mg/dL (ref 8.4–10.5)
Creatinine, Ser: 0.67 mg/dL (ref 0.50–1.10)
GFR calc non Af Amer: 84 mL/min — ABNORMAL LOW (ref >90)
GLUCOSE: 188 mg/dL — AB (ref 70–99)
POTASSIUM: 4.6 meq/L (ref 3.7–5.3)
Sodium: 135 mEq/L — ABNORMAL LOW (ref 137–147)

## 2014-07-20 LAB — VANCOMYCIN, TROUGH: Vancomycin Tr: <5 ug/mL — ABNORMAL LOW (ref 10.0–20.0)

## 2014-07-24 LAB — CULTURE, BLOOD (ROUTINE X 2)
CULTURE: NO GROWTH
Culture: NO GROWTH

## 2014-07-25 LAB — CBC
HCT: 26.7 % — ABNORMAL LOW (ref 36.0–46.0)
Hemoglobin: 8.9 g/dL — ABNORMAL LOW (ref 12.0–15.0)
MCH: 23.4 pg — ABNORMAL LOW (ref 26.0–34.0)
MCHC: 33.3 g/dL (ref 30.0–36.0)
MCV: 70.3 fL — ABNORMAL LOW (ref 78.0–100.0)
PLATELETS: 570 10*3/uL — AB (ref 150–400)
RBC: 3.8 MIL/uL — AB (ref 3.87–5.11)
RDW: 17 % — ABNORMAL HIGH (ref 11.5–15.5)
WBC: 10.2 10*3/uL (ref 4.0–10.5)

## 2014-07-25 LAB — BASIC METABOLIC PANEL
ANION GAP: 13 (ref 5–15)
BUN: 33 mg/dL — ABNORMAL HIGH (ref 6–23)
CHLORIDE: 98 meq/L (ref 96–112)
CO2: 27 mEq/L (ref 19–32)
CREATININE: 0.64 mg/dL (ref 0.50–1.10)
Calcium: 10.2 mg/dL (ref 8.4–10.5)
GFR calc Af Amer: >90 mL/min (ref >90)
GFR calc non Af Amer: 85 mL/min — ABNORMAL LOW (ref >90)
Glucose, Bld: 80 mg/dL (ref 70–99)
POTASSIUM: 4.9 meq/L (ref 3.7–5.3)
Sodium: 138 mEq/L (ref 137–147)

## 2014-07-30 ENCOUNTER — Inpatient Hospital Stay: Payer: Self-pay | Admitting: Internal Medicine

## 2014-07-30 LAB — COMPREHENSIVE METABOLIC PANEL
AST: 51 U/L — AB (ref 15–37)
Albumin: 2.5 g/dL — ABNORMAL LOW (ref 3.4–5.0)
Alkaline Phosphatase: 115 U/L
Anion Gap: 7 (ref 7–16)
BUN: 48 mg/dL — ABNORMAL HIGH (ref 7–18)
Bilirubin,Total: 0.2 mg/dL (ref 0.2–1.0)
CO2: 32 mmol/L (ref 21–32)
Calcium, Total: 10.9 mg/dL — ABNORMAL HIGH (ref 8.5–10.1)
Chloride: 107 mmol/L (ref 98–107)
Creatinine: 0.88 mg/dL (ref 0.60–1.30)
EGFR (African American): >60
EGFR (Non-African Amer.): >60
Glucose: 182 mg/dL — ABNORMAL HIGH (ref 65–99)
Osmolality: 308 (ref 275–301)
Potassium: 4.6 mmol/L (ref 3.5–5.1)
SGPT (ALT): 183 U/L — ABNORMAL HIGH
SODIUM: 146 mmol/L — AB (ref 136–145)
Total Protein: 8.1 g/dL (ref 6.4–8.2)

## 2014-07-30 LAB — CBC WITH DIFFERENTIAL/PLATELET
Basophil #: 0 10*3/uL (ref 0.0–0.1)
Basophil %: 0.2 %
EOS PCT: 0.4 %
Eosinophil #: 0.1 10*3/uL (ref 0.0–0.7)
HCT: 30 % — ABNORMAL LOW (ref 35.0–47.0)
HGB: 9.8 g/dL — ABNORMAL LOW (ref 12.0–16.0)
Lymphocyte #: 1.8 10*3/uL (ref 1.0–3.6)
Lymphocyte %: 8.8 %
MCH: 23.3 pg — AB (ref 26.0–34.0)
MCHC: 32.8 g/dL (ref 32.0–36.0)
MCV: 71 fL — AB (ref 80–100)
Monocyte #: 3.2 x10 3/mm — ABNORMAL HIGH (ref 0.2–0.9)
Monocyte %: 15.2 %
Neutrophil #: 15.9 10*3/uL — ABNORMAL HIGH (ref 1.4–6.5)
Neutrophil %: 75.4 %
PLATELETS: 712 10*3/uL — AB (ref 150–440)
RBC: 4.22 10*6/uL (ref 3.80–5.20)
RDW: 17.7 % — AB (ref 11.5–14.5)
WBC: 21 10*3/uL — ABNORMAL HIGH (ref 3.6–11.0)

## 2014-07-30 LAB — URINALYSIS, COMPLETE
Bilirubin,UR: NEGATIVE
Blood: NEGATIVE
Glucose,UR: NEGATIVE mg/dL (ref 0–75)
Ketone: NEGATIVE
Nitrite: NEGATIVE
Ph: 5 (ref 4.5–8.0)
RBC,UR: 412 /HPF (ref 0–5)
SPECIFIC GRAVITY: 1.02 (ref 1.003–1.030)

## 2014-07-30 LAB — MAGNESIUM: MAGNESIUM: 2.1 mg/dL

## 2014-07-30 LAB — PROTIME-INR
INR: 1.2
Prothrombin Time: 14.9 secs — ABNORMAL HIGH (ref 11.5–14.7)

## 2014-07-30 LAB — HEMOGLOBIN A1C: Hemoglobin A1C: 7.5 % — ABNORMAL HIGH (ref 4.2–6.3)

## 2014-07-30 LAB — TSH: Thyroid Stimulating Horm: 1.77 u[IU]/mL

## 2014-07-30 LAB — PHOSPHORUS: Phosphorus: 2.6 mg/dL (ref 2.5–4.9)

## 2014-07-30 LAB — T4, FREE: Free Thyroxine: 0.8 ng/dL (ref 0.76–1.46)

## 2014-07-30 LAB — TROPONIN I: Troponin-I: <0.02 ng/mL

## 2014-08-01 LAB — BASIC METABOLIC PANEL
Anion Gap: 6 — ABNORMAL LOW (ref 7–16)
BUN: 36 mg/dL — AB (ref 7–18)
CHLORIDE: 107 mmol/L (ref 98–107)
Calcium, Total: 9.7 mg/dL (ref 8.5–10.1)
Co2: 33 mmol/L — ABNORMAL HIGH (ref 21–32)
Creatinine: 0.71 mg/dL (ref 0.60–1.30)
EGFR (African American): >60
EGFR (Non-African Amer.): >60
Glucose: 75 mg/dL (ref 65–99)
Osmolality: 298 (ref 275–301)
Potassium: 3.9 mmol/L (ref 3.5–5.1)
Sodium: 146 mmol/L — ABNORMAL HIGH (ref 136–145)

## 2014-08-01 LAB — CBC WITH DIFFERENTIAL/PLATELET
BASOS ABS: 0.1 10*3/uL (ref 0.0–0.1)
BASOS PCT: 0.5 %
EOS PCT: 4.9 %
Eosinophil #: 0.5 10*3/uL (ref 0.0–0.7)
HCT: 25.8 % — ABNORMAL LOW (ref 35.0–47.0)
HGB: 8.2 g/dL — ABNORMAL LOW (ref 12.0–16.0)
LYMPHS ABS: 1.7 10*3/uL (ref 1.0–3.6)
Lymphocyte %: 16.4 %
MCH: 23.1 pg — ABNORMAL LOW (ref 26.0–34.0)
MCHC: 31.8 g/dL — ABNORMAL LOW (ref 32.0–36.0)
MCV: 73 fL — ABNORMAL LOW (ref 80–100)
MONO ABS: 2 x10 3/mm — AB (ref 0.2–0.9)
Monocyte %: 18.7 %
NEUTROS ABS: 6.3 10*3/uL (ref 1.4–6.5)
Neutrophil %: 59.5 %
Platelet: 552 10*3/uL — ABNORMAL HIGH (ref 150–440)
RBC: 3.56 10*6/uL — ABNORMAL LOW (ref 3.80–5.20)
RDW: 17.7 % — ABNORMAL HIGH (ref 11.5–14.5)
WBC: 10.5 10*3/uL (ref 3.6–11.0)

## 2014-08-03 LAB — URINE CULTURE

## 2014-08-04 LAB — CULTURE, BLOOD (SINGLE)

## 2014-08-06 ENCOUNTER — Inpatient Hospital Stay: Payer: Self-pay | Admitting: Internal Medicine

## 2014-08-06 LAB — CBC WITH DIFFERENTIAL/PLATELET
BASOS ABS: 0.2 10*3/uL — AB (ref 0.0–0.1)
Bands: 9 %
Basophil #: 0 10*3/uL (ref 0.0–0.1)
Basophil #: 0.1 10*3/uL (ref 0.0–0.1)
Basophil %: 0.2 %
Basophil %: 0.3 %
Basophil %: 0.7 %
Eosinophil #: 0 10*3/uL (ref 0.0–0.7)
Eosinophil #: 0 10*3/uL (ref 0.0–0.7)
Eosinophil #: 0 10*3/uL (ref 0.0–0.7)
Eosinophil %: 0.1 %
Eosinophil %: 0.1 %
Eosinophil %: 0.2 %
HCT: 27.5 % — ABNORMAL LOW (ref 35.0–47.0)
HCT: 28.3 % — ABNORMAL LOW (ref 35.0–47.0)
HCT: 28.8 % — ABNORMAL LOW (ref 35.0–47.0)
HCT: 29.1 % — ABNORMAL LOW (ref 35.0–47.0)
HGB: 8.5 g/dL — ABNORMAL LOW (ref 12.0–16.0)
HGB: 8.9 g/dL — ABNORMAL LOW (ref 12.0–16.0)
HGB: 9 g/dL — ABNORMAL LOW (ref 12.0–16.0)
HGB: 9.1 g/dL — ABNORMAL LOW (ref 12.0–16.0)
LYMPHS PCT: 8.3 %
Lymphocyte #: 1.2 10*3/uL (ref 1.0–3.6)
Lymphocyte #: 1.5 10*3/uL (ref 1.0–3.6)
Lymphocyte #: 1.9 10*3/uL (ref 1.0–3.6)
Lymphocyte %: 6.8 %
Lymphocyte %: 5.7 %
Lymphocytes: 17 %
MCH: 22.2 pg — ABNORMAL LOW (ref 26.0–34.0)
MCH: 22.3 pg — ABNORMAL LOW (ref 26.0–34.0)
MCH: 22.4 pg — ABNORMAL LOW (ref 26.0–34.0)
MCH: 22.4 pg — ABNORMAL LOW (ref 26.0–34.0)
MCHC: 30.8 g/dL — ABNORMAL LOW (ref 32.0–36.0)
MCHC: 31.2 g/dL — AB (ref 32.0–36.0)
MCHC: 31.4 g/dL — ABNORMAL LOW (ref 32.0–36.0)
MCHC: 31.5 g/dL — ABNORMAL LOW (ref 32.0–36.0)
MCV: 71 fL — ABNORMAL LOW (ref 80–100)
MCV: 71 fL — ABNORMAL LOW (ref 80–100)
MCV: 72 fL — AB (ref 80–100)
MCV: 72 fL — ABNORMAL LOW (ref 80–100)
MONOS PCT: 12 %
Monocyte #: 2.5 x10 3/mm — ABNORMAL HIGH (ref 0.2–0.9)
Monocyte #: 2.6 x10 3/mm — ABNORMAL HIGH (ref 0.2–0.9)
Monocyte #: 2.8 x10 3/mm — ABNORMAL HIGH (ref 0.2–0.9)
Monocyte %: 11.4 %
Monocyte %: 11.8 %
Monocytes: 4 %
NEUTROS ABS: 18.4 10*3/uL — AB (ref 1.4–6.5)
NEUTROS PCT: 78.8 %
Neutrophil #: 17.6 10*3/uL — ABNORMAL HIGH (ref 1.4–6.5)
Neutrophil #: 18 10*3/uL — ABNORMAL HIGH (ref 1.4–6.5)
Neutrophil %: 81.5 %
Neutrophil %: 82.1 %
Platelet: 441 10*3/uL — ABNORMAL HIGH (ref 150–440)
Platelet: 482 10*3/uL — ABNORMAL HIGH (ref 150–440)
Platelet: 487 10*3/uL — ABNORMAL HIGH (ref 150–440)
Platelet: 490 10*3/uL — ABNORMAL HIGH (ref 150–440)
RBC: 3.81 10*6/uL (ref 3.80–5.20)
RBC: 3.98 10*6/uL (ref 3.80–5.20)
RBC: 4.04 10*6/uL (ref 3.80–5.20)
RBC: 4.05 10*6/uL (ref 3.80–5.20)
RDW: 17.6 % — AB (ref 11.5–14.5)
RDW: 17.6 % — ABNORMAL HIGH (ref 11.5–14.5)
RDW: 17.7 % — ABNORMAL HIGH (ref 11.5–14.5)
RDW: 17.8 % — ABNORMAL HIGH (ref 11.5–14.5)
Segmented Neutrophils: 70 %
WBC: 21.6 10*3/uL — ABNORMAL HIGH (ref 3.6–11.0)
WBC: 21.9 10*3/uL — ABNORMAL HIGH (ref 3.6–11.0)
WBC: 23.3 10*3/uL — AB (ref 3.6–11.0)
WBC: 26.7 10*3/uL — ABNORMAL HIGH (ref 3.6–11.0)

## 2014-08-06 LAB — COMPREHENSIVE METABOLIC PANEL
ALBUMIN: 1.8 g/dL — AB (ref 3.4–5.0)
Alkaline Phosphatase: 129 U/L — ABNORMAL HIGH
Anion Gap: 7 (ref 7–16)
BUN: 53 mg/dL — ABNORMAL HIGH (ref 7–18)
Bilirubin,Total: 0.1 mg/dL — ABNORMAL LOW (ref 0.2–1.0)
CALCIUM: 10 mg/dL (ref 8.5–10.1)
CO2: 31 mmol/L (ref 21–32)
Chloride: 117 mmol/L — ABNORMAL HIGH (ref 98–107)
Creatinine: 0.95 mg/dL (ref 0.60–1.30)
EGFR (Non-African Amer.): >60
GLUCOSE: 217 mg/dL — AB (ref 65–99)
Osmolality: 328 (ref 275–301)
Potassium: 4.2 mmol/L (ref 3.5–5.1)
SGOT(AST): 222 U/L — ABNORMAL HIGH (ref 15–37)
SGPT (ALT): 499 U/L — ABNORMAL HIGH
SODIUM: 155 mmol/L — AB (ref 136–145)
Total Protein: 7 g/dL (ref 6.4–8.2)

## 2014-08-06 LAB — URINALYSIS, COMPLETE
BLOOD: NEGATIVE
Bilirubin,UR: NEGATIVE
Glucose,UR: NEGATIVE mg/dL (ref 0–75)
Ketone: NEGATIVE
NITRITE: NEGATIVE
PH: 5 (ref 4.5–8.0)
Protein: 100
Specific Gravity: 1.02 (ref 1.003–1.030)
WBC UR: 656 /HPF (ref 0–5)

## 2014-08-06 LAB — LIPASE, BLOOD: LIPASE: 110 U/L (ref 73–393)

## 2014-08-06 LAB — MAGNESIUM: MAGNESIUM: 2.2 mg/dL

## 2014-08-06 LAB — T4, FREE: Free Thyroxine: 1 ng/dL (ref 0.76–1.46)

## 2014-08-06 LAB — CK TOTAL AND CKMB (NOT AT ARMC)
CK, TOTAL: 78 U/L (ref 26–192)
CK-MB: 1.8 ng/mL (ref 0.5–3.6)

## 2014-08-06 LAB — TROPONIN I: TROPONIN-I: 0.02 ng/mL

## 2014-08-06 LAB — PRO B NATRIURETIC PEPTIDE: B-TYPE NATIURETIC PEPTID: 204 pg/mL (ref 0–450)

## 2014-08-06 LAB — TSH: THYROID STIMULATING HORM: 0.63 u[IU]/mL

## 2014-08-07 LAB — BASIC METABOLIC PANEL
Anion Gap: 6 — ABNORMAL LOW (ref 7–16)
Anion Gap: 6 — ABNORMAL LOW (ref 7–16)
Anion Gap: 5 — ABNORMAL LOW (ref 7–16)
BUN: 59 mg/dL — ABNORMAL HIGH (ref 7–18)
BUN: 69 mg/dL — ABNORMAL HIGH (ref 7–18)
BUN: 67 mg/dL — ABNORMAL HIGH (ref 7–18)
Calcium, Total: 10.1 mg/dL (ref 8.5–10.1)
Calcium, Total: 10.3 mg/dL — ABNORMAL HIGH (ref 8.5–10.1)
Calcium, Total: 10.2 mg/dL — ABNORMAL HIGH (ref 8.5–10.1)
Chloride: 115 mmol/L — ABNORMAL HIGH (ref 98–107)
Chloride: 114 mmol/L — ABNORMAL HIGH (ref 98–107)
Chloride: 113 mmol/L — ABNORMAL HIGH (ref 98–107)
Co2: 32 mmol/L (ref 21–32)
Co2: 33 mmol/L — ABNORMAL HIGH (ref 21–32)
Co2: 33 mmol/L — ABNORMAL HIGH (ref 21–32)
Creatinine: 1.28 mg/dL (ref 0.60–1.30)
Creatinine: 1.3 mg/dL (ref 0.60–1.30)
Creatinine: 1.13 mg/dL (ref 0.60–1.30)
EGFR (African American): 52 — ABNORMAL LOW
EGFR (African American): 51 — ABNORMAL LOW
EGFR (African American): >60
EGFR (Non-African Amer.): 43 — ABNORMAL LOW
EGFR (Non-African Amer.): 42 — ABNORMAL LOW
EGFR (Non-African Amer.): 50 — ABNORMAL LOW
Glucose: 292 mg/dL — ABNORMAL HIGH (ref 65–99)
Glucose: 382 mg/dL — ABNORMAL HIGH (ref 65–99)
Glucose: 389 mg/dL — ABNORMAL HIGH (ref 65–99)
Osmolality: 331 (ref 275–301)
Osmolality: 339 (ref 275–301)
Osmolality: 335 (ref 275–301)
Potassium: 3.7 mmol/L (ref 3.5–5.1)
Potassium: 3.8 mmol/L (ref 3.5–5.1)
Potassium: 3.1 mmol/L — ABNORMAL LOW (ref 3.5–5.1)
Sodium: 153 mmol/L — ABNORMAL HIGH (ref 136–145)
Sodium: 153 mmol/L — ABNORMAL HIGH (ref 136–145)
Sodium: 151 mmol/L — ABNORMAL HIGH (ref 136–145)

## 2014-08-08 LAB — BASIC METABOLIC PANEL
Anion Gap: 5 — ABNORMAL LOW (ref 7–16)
Anion Gap: 7 (ref 7–16)
Anion Gap: 10 (ref 7–16)
Anion Gap: 7 (ref 7–16)
BUN: 61 mg/dL — AB (ref 7–18)
BUN: 56 mg/dL — ABNORMAL HIGH (ref 7–18)
BUN: 47 mg/dL — ABNORMAL HIGH (ref 7–18)
BUN: 39 mg/dL — ABNORMAL HIGH (ref 7–18)
CHLORIDE: 113 mmol/L — AB (ref 98–107)
CREATININE: 1.02 mg/dL (ref 0.60–1.30)
Calcium, Total: 10.1 mg/dL (ref 8.5–10.1)
Calcium, Total: 9.5 mg/dL (ref 8.5–10.1)
Calcium, Total: 10 mg/dL (ref 8.5–10.1)
Calcium, Total: 8.6 mg/dL (ref 8.5–10.1)
Chloride: 113 mmol/L — ABNORMAL HIGH (ref 98–107)
Chloride: 109 mmol/L — ABNORMAL HIGH (ref 98–107)
Chloride: 99 mmol/L (ref 98–107)
Co2: 34 mmol/L — ABNORMAL HIGH (ref 21–32)
Co2: 31 mmol/L (ref 21–32)
Co2: 32 mmol/L (ref 21–32)
Co2: 29 mmol/L (ref 21–32)
Creatinine: 0.85 mg/dL (ref 0.60–1.30)
Creatinine: 0.77 mg/dL (ref 0.60–1.30)
Creatinine: 0.75 mg/dL (ref 0.60–1.30)
EGFR (African American): >60
EGFR (African American): >60
EGFR (African American): >60
EGFR (Non-African Amer.): 56 — ABNORMAL LOW
EGFR (Non-African Amer.): >60
EGFR (Non-African Amer.): >60
EGFR (Non-African Amer.): >60
Glucose: 242 mg/dL — ABNORMAL HIGH (ref 65–99)
Glucose: 112 mg/dL — ABNORMAL HIGH (ref 65–99)
Glucose: 139 mg/dL — ABNORMAL HIGH (ref 65–99)
Glucose: 519 mg/dL (ref 65–99)
OSMOLALITY: 327 (ref 275–301)
Osmolality: 316 (ref 275–301)
Osmolality: 314 (ref 275–301)
Osmolality: 303 (ref 275–301)
POTASSIUM: 3.3 mmol/L — AB (ref 3.5–5.1)
Potassium: 4 mmol/L (ref 3.5–5.1)
Potassium: 3.5 mmol/L (ref 3.5–5.1)
Potassium: 3.1 mmol/L — ABNORMAL LOW (ref 3.5–5.1)
Sodium: 152 mmol/L — ABNORMAL HIGH (ref 136–145)
Sodium: 151 mmol/L — ABNORMAL HIGH (ref 136–145)
Sodium: 151 mmol/L — ABNORMAL HIGH (ref 136–145)
Sodium: 135 mmol/L — ABNORMAL LOW (ref 136–145)

## 2014-08-08 LAB — CBC WITH DIFFERENTIAL/PLATELET
Basophil #: 0.1 10*3/uL (ref 0.0–0.1)
Basophil #: 0 10*3/uL (ref 0.0–0.1)
Basophil %: 0.3 %
Basophil %: 0.2 %
EOS ABS: 0.1 10*3/uL (ref 0.0–0.7)
EOS ABS: 0.3 10*3/uL (ref 0.0–0.7)
Eosinophil %: 0.7 %
Eosinophil %: 1.5 %
HCT: 24.1 % — AB (ref 35.0–47.0)
HCT: 23.3 % — AB (ref 35.0–47.0)
HGB: 7.6 g/dL — ABNORMAL LOW (ref 12.0–16.0)
HGB: 7.3 g/dL — ABNORMAL LOW (ref 12.0–16.0)
Lymphocyte #: 1.5 10*3/uL (ref 1.0–3.6)
Lymphocyte #: 1.5 10*3/uL (ref 1.0–3.6)
Lymphocyte %: 7.6 %
Lymphocyte %: 8.4 %
MCH: 22.6 pg — AB (ref 26.0–34.0)
MCH: 22.5 pg — ABNORMAL LOW (ref 26.0–34.0)
MCHC: 31.7 g/dL — ABNORMAL LOW (ref 32.0–36.0)
MCHC: 31.4 g/dL — ABNORMAL LOW (ref 32.0–36.0)
MCV: 71 fL — AB (ref 80–100)
MCV: 72 fL — ABNORMAL LOW (ref 80–100)
MONO ABS: 2.2 x10 3/mm — AB (ref 0.2–0.9)
MONO ABS: 2.2 x10 3/mm — AB (ref 0.2–0.9)
MONOS PCT: 12.9 %
Monocyte %: 11.2 %
NEUTROS ABS: 15.6 10*3/uL — AB (ref 1.4–6.5)
NEUTROS PCT: 77 %
Neutrophil #: 13.4 10*3/uL — ABNORMAL HIGH (ref 1.4–6.5)
Neutrophil %: 80.2 %
PLATELETS: 316 10*3/uL (ref 150–440)
Platelet: 369 10*3/uL (ref 150–440)
RBC: 3.37 10*6/uL — AB (ref 3.80–5.20)
RBC: 3.24 10*6/uL — AB (ref 3.80–5.20)
RDW: 17.5 % — AB (ref 11.5–14.5)
RDW: 17.4 % — AB (ref 11.5–14.5)
WBC: 19.5 10*3/uL — ABNORMAL HIGH (ref 3.6–11.0)
WBC: 17.4 10*3/uL — AB (ref 3.6–11.0)

## 2014-08-08 LAB — COMPREHENSIVE METABOLIC PANEL
ALK PHOS: 119 U/L — AB
Albumin: 1.6 g/dL — ABNORMAL LOW (ref 3.4–5.0)
Anion Gap: 3 — ABNORMAL LOW (ref 7–16)
BILIRUBIN TOTAL: 0.1 mg/dL — AB (ref 0.2–1.0)
BUN: 56 mg/dL — ABNORMAL HIGH (ref 7–18)
CALCIUM: 10.3 mg/dL — AB (ref 8.5–10.1)
CO2: 35 mmol/L — AB (ref 21–32)
Chloride: 110 mmol/L — ABNORMAL HIGH (ref 98–107)
Creatinine: 0.92 mg/dL (ref 0.60–1.30)
EGFR (African American): >60
EGFR (Non-African Amer.): >60
Glucose: 168 mg/dL — ABNORMAL HIGH (ref 65–99)
OSMOLALITY: 314 (ref 275–301)
POTASSIUM: 3.3 mmol/L — AB (ref 3.5–5.1)
SGOT(AST): 106 U/L — ABNORMAL HIGH (ref 15–37)
SGPT (ALT): 473 U/L — ABNORMAL HIGH
SODIUM: 148 mmol/L — AB (ref 136–145)
Total Protein: 6 g/dL — ABNORMAL LOW (ref 6.4–8.2)

## 2014-08-09 LAB — CBC WITH DIFFERENTIAL/PLATELET
Basophil #: 0 10*3/uL (ref 0.0–0.1)
Basophil %: 0.2 %
Eosinophil #: 0.5 10*3/uL (ref 0.0–0.7)
Eosinophil %: 3.2 %
HCT: 21.7 % — ABNORMAL LOW (ref 35.0–47.0)
HGB: 6.7 g/dL — ABNORMAL LOW (ref 12.0–16.0)
Lymphocyte #: 1.2 10*3/uL (ref 1.0–3.6)
Lymphocyte %: 8.4 %
MCH: 22.2 pg — ABNORMAL LOW (ref 26.0–34.0)
MCHC: 30.9 g/dL — ABNORMAL LOW (ref 32.0–36.0)
MCV: 72 fL — ABNORMAL LOW (ref 80–100)
Monocyte #: 1.8 x10 3/mm — ABNORMAL HIGH (ref 0.2–0.9)
Monocyte %: 12.2 %
Neutrophil #: 11.2 10*3/uL — ABNORMAL HIGH (ref 1.4–6.5)
Neutrophil %: 76 %
Platelet: 329 10*3/uL (ref 150–440)
RBC: 3.02 10*6/uL — ABNORMAL LOW (ref 3.80–5.20)
RDW: 17.3 % — ABNORMAL HIGH (ref 11.5–14.5)
WBC: 14.7 10*3/uL — ABNORMAL HIGH (ref 3.6–11.0)

## 2014-08-09 LAB — COMPREHENSIVE METABOLIC PANEL
Albumin: 1.4 g/dL — ABNORMAL LOW (ref 3.4–5.0)
Alkaline Phosphatase: 108 U/L
Anion Gap: 6 — ABNORMAL LOW (ref 7–16)
BUN: 35 mg/dL — ABNORMAL HIGH (ref 7–18)
Bilirubin,Total: 0.3 mg/dL (ref 0.2–1.0)
Calcium, Total: 8.4 mg/dL — ABNORMAL LOW (ref 8.5–10.1)
Chloride: 114 mmol/L — ABNORMAL HIGH (ref 98–107)
Co2: 29 mmol/L (ref 21–32)
Creatinine: 0.64 mg/dL (ref 0.60–1.30)
EGFR (African American): >60
EGFR (Non-African Amer.): >60
Glucose: 80 mg/dL (ref 65–99)
Osmolality: 303 (ref 275–301)
Potassium: 3.1 mmol/L — ABNORMAL LOW (ref 3.5–5.1)
SGOT(AST): 65 U/L — ABNORMAL HIGH (ref 15–37)
SGPT (ALT): 284 U/L — ABNORMAL HIGH
Sodium: 149 mmol/L — ABNORMAL HIGH (ref 136–145)
Total Protein: 5.4 g/dL — ABNORMAL LOW (ref 6.4–8.2)

## 2014-08-09 LAB — BASIC METABOLIC PANEL
Anion Gap: 7 (ref 7–16)
BUN: 38 mg/dL — AB (ref 7–18)
CO2: 30 mmol/L (ref 21–32)
Calcium, Total: 8.4 mg/dL — ABNORMAL LOW (ref 8.5–10.1)
Chloride: 98 mmol/L (ref 98–107)
Creatinine: 0.74 mg/dL (ref 0.60–1.30)
EGFR (African American): >60
EGFR (Non-African Amer.): >60
GLUCOSE: 501 mg/dL — AB (ref 65–99)
Osmolality: 302 (ref 275–301)
Potassium: 3 mmol/L — ABNORMAL LOW (ref 3.5–5.1)
Sodium: 135 mmol/L — ABNORMAL LOW (ref 136–145)

## 2014-08-10 LAB — BASIC METABOLIC PANEL
Anion Gap: 5 — ABNORMAL LOW (ref 7–16)
BUN: 23 mg/dL — ABNORMAL HIGH (ref 7–18)
CO2: 30 mmol/L (ref 21–32)
Calcium, Total: 9.2 mg/dL (ref 8.5–10.1)
Chloride: 110 mmol/L — ABNORMAL HIGH (ref 98–107)
Creatinine: 0.56 mg/dL — ABNORMAL LOW (ref 0.60–1.30)
EGFR (African American): >60
EGFR (Non-African Amer.): >60
Glucose: 178 mg/dL — ABNORMAL HIGH (ref 65–99)
OSMOLALITY: 297 (ref 275–301)
Potassium: 4 mmol/L (ref 3.5–5.1)
SODIUM: 145 mmol/L (ref 136–145)

## 2014-08-10 LAB — CBC WITH DIFFERENTIAL/PLATELET
Basophil #: 0 10*3/uL (ref 0.0–0.1)
Basophil %: 0.3 %
Eosinophil #: 0.5 10*3/uL (ref 0.0–0.7)
Eosinophil %: 3.7 %
HCT: 27.8 % — ABNORMAL LOW (ref 35.0–47.0)
HGB: 8.8 g/dL — AB (ref 12.0–16.0)
LYMPHS ABS: 1.5 10*3/uL (ref 1.0–3.6)
LYMPHS PCT: 11.6 %
MCH: 23.6 pg — ABNORMAL LOW (ref 26.0–34.0)
MCHC: 31.7 g/dL — AB (ref 32.0–36.0)
MCV: 74 fL — AB (ref 80–100)
MONOS PCT: 15.3 %
Monocyte #: 2 x10 3/mm — ABNORMAL HIGH (ref 0.2–0.9)
NEUTROS ABS: 9.1 10*3/uL — AB (ref 1.4–6.5)
Neutrophil %: 69.1 %
PLATELETS: 358 10*3/uL (ref 150–440)
RBC: 3.75 10*6/uL — ABNORMAL LOW (ref 3.80–5.20)
RDW: 20 % — ABNORMAL HIGH (ref 11.5–14.5)
WBC: 13.1 10*3/uL — AB (ref 3.6–11.0)

## 2014-08-10 LAB — URINE CULTURE

## 2014-08-11 LAB — CULTURE, BLOOD (SINGLE)

## 2014-08-12 LAB — CBC WITH DIFFERENTIAL/PLATELET
BASOS ABS: 0.1 10*3/uL (ref 0.0–0.1)
Basophil %: 0.5 %
EOS PCT: 5.2 %
Eosinophil #: 0.6 10*3/uL (ref 0.0–0.7)
HCT: 26.7 % — AB (ref 35.0–47.0)
HGB: 8.7 g/dL — ABNORMAL LOW (ref 12.0–16.0)
LYMPHS ABS: 1.3 10*3/uL (ref 1.0–3.6)
LYMPHS PCT: 10.6 %
MCH: 23.8 pg — ABNORMAL LOW (ref 26.0–34.0)
MCHC: 32.4 g/dL (ref 32.0–36.0)
MCV: 73 fL — ABNORMAL LOW (ref 80–100)
Monocyte #: 1.9 x10 3/mm — ABNORMAL HIGH (ref 0.2–0.9)
Monocyte %: 15.8 %
NEUTROS ABS: 8.3 10*3/uL — AB (ref 1.4–6.5)
Neutrophil %: 67.9 %
Platelet: 431 10*3/uL (ref 150–440)
RBC: 3.64 10*6/uL — AB (ref 3.80–5.20)
RDW: 20.8 % — AB (ref 11.5–14.5)
WBC: 12.2 10*3/uL — ABNORMAL HIGH (ref 3.6–11.0)

## 2014-08-12 LAB — BASIC METABOLIC PANEL
Anion Gap: 6 — ABNORMAL LOW (ref 7–16)
BUN: 13 mg/dL (ref 7–18)
Calcium, Total: 9.3 mg/dL (ref 8.5–10.1)
Chloride: 105 mmol/L (ref 98–107)
Co2: 29 mmol/L (ref 21–32)
Creatinine: 0.58 mg/dL — ABNORMAL LOW (ref 0.60–1.30)
EGFR (African American): >60
GLUCOSE: 162 mg/dL — AB (ref 65–99)
Osmolality: 283 (ref 275–301)
POTASSIUM: 3.7 mmol/L (ref 3.5–5.1)
Sodium: 140 mmol/L (ref 136–145)

## 2014-08-12 LAB — CLOSTRIDIUM DIFFICILE(ARMC)

## 2014-08-15 ENCOUNTER — Ambulatory Visit: Payer: Self-pay | Admitting: Internal Medicine

## 2014-09-03 ENCOUNTER — Inpatient Hospital Stay: Payer: Self-pay | Admitting: Internal Medicine

## 2014-09-03 LAB — URINALYSIS, COMPLETE
BILIRUBIN, UR: NEGATIVE
Bilirubin,UR: NEGATIVE
Glucose,UR: >=500 mg/dL (ref 0–75)
KETONE: NEGATIVE
Ketone: NEGATIVE
NITRITE: NEGATIVE
NITRITE: NEGATIVE
Ph: 6 (ref 4.5–8.0)
Ph: 6 (ref 4.5–8.0)
Protein: 100
RBC,UR: 820 /HPF (ref 0–5)
RBC,UR: 1099 /HPF (ref 0–5)
Specific Gravity: 1.018 (ref 1.003–1.030)
Specific Gravity: 1.024 (ref 1.003–1.030)
Squamous Epithelial: 42
Squamous Epithelial: 42
WBC UR: 1475 /HPF (ref 0–5)
WBC UR: 3496 /HPF (ref 0–5)

## 2014-09-03 LAB — TROPONIN I: Troponin-I: <0.02 ng/mL

## 2014-09-03 LAB — COMPREHENSIVE METABOLIC PANEL
ALK PHOS: 165 U/L — AB
AST: 64 U/L — AB (ref 15–37)
Albumin: 1.4 g/dL — ABNORMAL LOW (ref 3.4–5.0)
Anion Gap: 3 — ABNORMAL LOW (ref 7–16)
BILIRUBIN TOTAL: 0.2 mg/dL (ref 0.2–1.0)
BUN: 61 mg/dL — ABNORMAL HIGH (ref 7–18)
CHLORIDE: 117 mmol/L — AB (ref 98–107)
CO2: 36 mmol/L — AB (ref 21–32)
Calcium, Total: 9.9 mg/dL (ref 8.5–10.1)
Creatinine: 0.98 mg/dL (ref 0.60–1.30)
EGFR (Non-African Amer.): 59 — ABNORMAL LOW
GLUCOSE: 557 mg/dL — AB (ref 65–99)
Osmolality: 352 (ref 275–301)
Potassium: 4.3 mmol/L (ref 3.5–5.1)
SGPT (ALT): 191 U/L — ABNORMAL HIGH
Sodium: 156 mmol/L — ABNORMAL HIGH (ref 136–145)
TOTAL PROTEIN: 6.5 g/dL (ref 6.4–8.2)

## 2014-09-03 LAB — CBC WITH DIFFERENTIAL/PLATELET
BASOS ABS: 0.1 10*3/uL (ref 0.0–0.1)
Basophil %: 0.2 %
EOS PCT: 0.1 %
Eosinophil #: 0 10*3/uL (ref 0.0–0.7)
HCT: 27.4 % — ABNORMAL LOW (ref 35.0–47.0)
HGB: 8.4 g/dL — ABNORMAL LOW (ref 12.0–16.0)
LYMPHS ABS: 1.2 10*3/uL (ref 1.0–3.6)
LYMPHS PCT: 4.5 %
MCH: 22.4 pg — ABNORMAL LOW (ref 26.0–34.0)
MCHC: 30.6 g/dL — AB (ref 32.0–36.0)
MCV: 73 fL — ABNORMAL LOW (ref 80–100)
Monocyte #: 3 x10 3/mm — ABNORMAL HIGH (ref 0.2–0.9)
Monocyte %: 10.9 %
Neutrophil #: 23.3 10*3/uL — ABNORMAL HIGH (ref 1.4–6.5)
Neutrophil %: 84.3 %
Platelet: 567 10*3/uL — ABNORMAL HIGH (ref 150–440)
RBC: 3.74 10*6/uL — ABNORMAL LOW (ref 3.80–5.20)
RDW: 21.1 % — AB (ref 11.5–14.5)
WBC: 27.6 10*3/uL — ABNORMAL HIGH (ref 3.6–11.0)

## 2014-09-03 LAB — TSH: THYROID STIMULATING HORM: 0.686 u[IU]/mL

## 2014-09-03 LAB — PROTIME-INR
INR: 1.3
Prothrombin Time: 16.2 secs — ABNORMAL HIGH (ref 11.5–14.7)

## 2014-09-03 LAB — PHOSPHORUS: Phosphorus: 2.3 mg/dL — ABNORMAL LOW (ref 2.5–4.9)

## 2014-09-03 LAB — T4, FREE: FREE THYROXINE: 1.04 ng/dL (ref 0.76–1.46)

## 2014-09-03 LAB — MAGNESIUM: MAGNESIUM: 2.5 mg/dL — AB

## 2014-09-04 LAB — BASIC METABOLIC PANEL
Anion Gap: 5 — ABNORMAL LOW (ref 7–16)
Anion Gap: 6 — ABNORMAL LOW (ref 7–16)
BUN: 41 mg/dL — AB (ref 7–18)
BUN: 36 mg/dL — AB (ref 7–18)
CALCIUM: 9.3 mg/dL (ref 8.5–10.1)
CO2: 31 mmol/L (ref 21–32)
CREATININE: 0.72 mg/dL (ref 0.60–1.30)
Calcium, Total: 9.6 mg/dL (ref 8.5–10.1)
Chloride: 118 mmol/L — ABNORMAL HIGH (ref 98–107)
Chloride: 117 mmol/L — ABNORMAL HIGH (ref 98–107)
Co2: 27 mmol/L (ref 21–32)
Creatinine: 0.79 mg/dL (ref 0.60–1.30)
EGFR (African American): >60
EGFR (Non-African Amer.): >60
EGFR (Non-African Amer.): >60
GLUCOSE: 273 mg/dL — AB (ref 65–99)
Glucose: 236 mg/dL — ABNORMAL HIGH (ref 65–99)
OSMOLALITY: 323 (ref 275–301)
Osmolality: 316 (ref 275–301)
Potassium: 3.3 mmol/L — ABNORMAL LOW (ref 3.5–5.1)
Potassium: 4 mmol/L (ref 3.5–5.1)
SODIUM: 150 mmol/L — AB (ref 136–145)
Sodium: 154 mmol/L — ABNORMAL HIGH (ref 136–145)

## 2014-09-04 LAB — CBC WITH DIFFERENTIAL/PLATELET
BASOS ABS: 0 10*3/uL (ref 0.0–0.1)
Basophil %: 0.2 %
EOS PCT: 0.6 %
Eosinophil #: 0.2 10*3/uL (ref 0.0–0.7)
HCT: 24.9 % — AB (ref 35.0–47.0)
HGB: 7.6 g/dL — ABNORMAL LOW (ref 12.0–16.0)
LYMPHS ABS: 1.6 10*3/uL (ref 1.0–3.6)
Lymphocyte %: 5 %
MCH: 21.8 pg — ABNORMAL LOW (ref 26.0–34.0)
MCHC: 30.5 g/dL — AB (ref 32.0–36.0)
MCV: 72 fL — ABNORMAL LOW (ref 80–100)
MONOS PCT: 9.5 %
Monocyte #: 3 x10 3/mm — ABNORMAL HIGH (ref 0.2–0.9)
NEUTROS PCT: 84.7 %
Neutrophil #: 26.4 10*3/uL — ABNORMAL HIGH (ref 1.4–6.5)
Platelet: 524 10*3/uL — ABNORMAL HIGH (ref 150–440)
RBC: 3.48 10*6/uL — AB (ref 3.80–5.20)
RDW: 20.8 % — ABNORMAL HIGH (ref 11.5–14.5)
WBC: 31.2 10*3/uL — ABNORMAL HIGH (ref 3.6–11.0)

## 2014-09-04 LAB — CLOSTRIDIUM DIFFICILE(ARMC)

## 2014-09-05 LAB — CBC WITH DIFFERENTIAL/PLATELET
BASOS ABS: 0 10*3/uL (ref 0.0–0.1)
Basophil %: 0.1 %
Eosinophil #: 0.5 10*3/uL (ref 0.0–0.7)
Eosinophil %: 2.4 %
HCT: 23 % — ABNORMAL LOW (ref 35.0–47.0)
HGB: 6.9 g/dL — ABNORMAL LOW (ref 12.0–16.0)
LYMPHS ABS: 1.1 10*3/uL (ref 1.0–3.6)
Lymphocyte %: 5 %
MCH: 21.7 pg — ABNORMAL LOW (ref 26.0–34.0)
MCHC: 30 g/dL — ABNORMAL LOW (ref 32.0–36.0)
MCV: 73 fL — AB (ref 80–100)
Monocyte #: 1.8 x10 3/mm — ABNORMAL HIGH (ref 0.2–0.9)
Monocyte %: 8.5 %
Neutrophil #: 18 10*3/uL — ABNORMAL HIGH (ref 1.4–6.5)
Neutrophil %: 84 %
PLATELETS: 450 10*3/uL — AB (ref 150–440)
RBC: 3.17 10*6/uL — ABNORMAL LOW (ref 3.80–5.20)
RDW: 20.6 % — ABNORMAL HIGH (ref 11.5–14.5)
WBC: 21.4 10*3/uL — ABNORMAL HIGH (ref 3.6–11.0)

## 2014-09-05 LAB — BASIC METABOLIC PANEL
ANION GAP: 4 — AB (ref 7–16)
BUN: 33 mg/dL — AB (ref 7–18)
Calcium, Total: 9.1 mg/dL (ref 8.5–10.1)
Chloride: 117 mmol/L — ABNORMAL HIGH (ref 98–107)
Co2: 30 mmol/L (ref 21–32)
Creatinine: 0.73 mg/dL (ref 0.60–1.30)
EGFR (African American): >60
EGFR (Non-African Amer.): >60
Glucose: 225 mg/dL — ABNORMAL HIGH (ref 65–99)
Osmolality: 314 (ref 275–301)
POTASSIUM: 4 mmol/L (ref 3.5–5.1)
SODIUM: 151 mmol/L — AB (ref 136–145)

## 2014-09-05 LAB — URINE CULTURE

## 2014-09-06 LAB — BASIC METABOLIC PANEL
Anion Gap: 4 — ABNORMAL LOW (ref 7–16)
BUN: 21 mg/dL — ABNORMAL HIGH (ref 7–18)
CO2: 30 mmol/L (ref 21–32)
Calcium, Total: 9 mg/dL (ref 8.5–10.1)
Chloride: 111 mmol/L — ABNORMAL HIGH (ref 98–107)
Creatinine: 0.59 mg/dL — ABNORMAL LOW (ref 0.60–1.30)
EGFR (Non-African Amer.): >60
Glucose: 237 mg/dL — ABNORMAL HIGH (ref 65–99)
Osmolality: 299 (ref 275–301)
Potassium: 3.7 mmol/L (ref 3.5–5.1)
SODIUM: 145 mmol/L (ref 136–145)

## 2014-09-06 LAB — CBC WITH DIFFERENTIAL/PLATELET
Basophil #: 0 10*3/uL (ref 0.0–0.1)
Basophil %: 0.1 %
Eosinophil #: 0.6 10*3/uL (ref 0.0–0.7)
Eosinophil %: 2.6 %
HCT: 29 % — ABNORMAL LOW (ref 35.0–47.0)
HGB: 8.9 g/dL — ABNORMAL LOW (ref 12.0–16.0)
Lymphocyte #: 1.1 10*3/uL (ref 1.0–3.6)
Lymphocyte %: 5.2 %
MCH: 22.4 pg — ABNORMAL LOW (ref 26.0–34.0)
MCHC: 30.7 g/dL — ABNORMAL LOW (ref 32.0–36.0)
MCV: 73 fL — AB (ref 80–100)
MONOS PCT: 7.3 %
Monocyte #: 1.6 x10 3/mm — ABNORMAL HIGH (ref 0.2–0.9)
Neutrophil #: 18.4 10*3/uL — ABNORMAL HIGH (ref 1.4–6.5)
Neutrophil %: 84.8 %
Platelet: 465 10*3/uL — ABNORMAL HIGH (ref 150–440)
RBC: 3.97 10*6/uL (ref 3.80–5.20)
RDW: 21.3 % — AB (ref 11.5–14.5)
WBC: 21.7 10*3/uL — AB (ref 3.6–11.0)

## 2014-09-06 LAB — URINE CULTURE

## 2014-09-07 LAB — EXPECTORATED SPUTUM ASSESSMENT W REFEX TO RESP CULTURE

## 2014-09-08 LAB — CBC WITH DIFFERENTIAL/PLATELET
Basophil #: 0 10*3/uL (ref 0.0–0.1)
Basophil %: 0.2 %
EOS PCT: 3.6 %
Eosinophil #: 0.6 10*3/uL (ref 0.0–0.7)
HCT: 26.7 % — AB (ref 35.0–47.0)
HGB: 8.3 g/dL — ABNORMAL LOW (ref 12.0–16.0)
Lymphocyte #: 1.4 10*3/uL (ref 1.0–3.6)
Lymphocyte %: 8.2 %
MCH: 22.6 pg — AB (ref 26.0–34.0)
MCHC: 31 g/dL — ABNORMAL LOW (ref 32.0–36.0)
MCV: 73 fL — ABNORMAL LOW (ref 80–100)
Monocyte #: 2 x10 3/mm — ABNORMAL HIGH (ref 0.2–0.9)
Monocyte %: 11.9 %
Neutrophil #: 12.5 10*3/uL — ABNORMAL HIGH (ref 1.4–6.5)
Neutrophil %: 76.1 %
PLATELETS: 443 10*3/uL — AB (ref 150–440)
RBC: 3.67 10*6/uL — ABNORMAL LOW (ref 3.80–5.20)
RDW: 22.2 % — AB (ref 11.5–14.5)
WBC: 16.5 10*3/uL — AB (ref 3.6–11.0)

## 2014-09-08 LAB — CREATININE, SERUM
Creatinine: 0.55 mg/dL — ABNORMAL LOW (ref 0.60–1.30)
EGFR (Non-African Amer.): >60

## 2014-09-08 LAB — VANCOMYCIN, TROUGH: VANCOMYCIN, TROUGH: 13 ug/mL (ref 10–20)

## 2014-09-08 LAB — CULTURE, BLOOD (SINGLE)

## 2014-09-08 LAB — SODIUM: Sodium: 146 mmol/L — ABNORMAL HIGH (ref 136–145)

## 2014-09-09 DIAGNOSIS — I361 Nonrheumatic tricuspid (valve) insufficiency: Secondary | ICD-10-CM

## 2014-09-10 LAB — VANCOMYCIN, TROUGH: Vancomycin, Trough: 18 ug/mL (ref 10–20)

## 2014-09-10 LAB — BASIC METABOLIC PANEL
ANION GAP: 6 — AB (ref 7–16)
BUN: 12 mg/dL (ref 7–18)
CALCIUM: 9 mg/dL (ref 8.5–10.1)
CREATININE: 0.48 mg/dL — AB (ref 0.60–1.30)
Chloride: 107 mmol/L (ref 98–107)
Co2: 30 mmol/L (ref 21–32)
EGFR (African American): >60
EGFR (Non-African Amer.): >60
Glucose: 163 mg/dL — ABNORMAL HIGH (ref 65–99)
Osmolality: 288 (ref 275–301)
Potassium: 4 mmol/L (ref 3.5–5.1)
Sodium: 143 mmol/L (ref 136–145)

## 2014-09-10 LAB — CBC WITH DIFFERENTIAL/PLATELET
BASOS PCT: 0.3 %
Basophil #: 0 10*3/uL (ref 0.0–0.1)
EOS PCT: 4.6 %
Eosinophil #: 0.5 10*3/uL (ref 0.0–0.7)
HCT: 26.6 % — AB (ref 35.0–47.0)
HGB: 8.4 g/dL — ABNORMAL LOW (ref 12.0–16.0)
Lymphocyte #: 1.1 10*3/uL (ref 1.0–3.6)
Lymphocyte %: 9.6 %
MCH: 22.7 pg — AB (ref 26.0–34.0)
MCHC: 31.5 g/dL — ABNORMAL LOW (ref 32.0–36.0)
MCV: 72 fL — AB (ref 80–100)
Monocyte #: 1.6 x10 3/mm — ABNORMAL HIGH (ref 0.2–0.9)
Monocyte %: 13.9 %
Neutrophil #: 8.5 10*3/uL — ABNORMAL HIGH (ref 1.4–6.5)
Neutrophil %: 71.6 %
PLATELETS: 477 10*3/uL — AB (ref 150–440)
RBC: 3.69 10*6/uL — AB (ref 3.80–5.20)
RDW: 22.5 % — ABNORMAL HIGH (ref 11.5–14.5)
WBC: 11.9 10*3/uL — AB (ref 3.6–11.0)

## 2014-09-10 LAB — WOUND CULTURE

## 2014-09-13 LAB — CULTURE, BLOOD (SINGLE)

## 2014-09-15 ENCOUNTER — Ambulatory Visit: Payer: Self-pay | Admitting: Internal Medicine

## 2014-09-27 LAB — EXPECTORATED SPUTUM ASSESSMENT W REFEX TO RESP CULTURE

## 2014-10-16 DEATH — deceased

## 2015-01-05 NOTE — H&P (Signed)
PATIENT NAME:  Michele Guerra, Michele Guerra MR#:  [id number] DATE OF BIRTH:  1941-07-01  DATE OF ADMISSION:  05/15/2013  PRIMARY CARE PHYSICIAN:  Dr. Yehuda Mao.  REFERRING PHYSICIAN:  Dr. Carollee Massed.   This is a 74 year old African American female with a past medical history of vascular dementia, prior CVA, as well as diabetes, who presented to [facility] from [facility] facility, with a 3-day duration of worsening mental status, compounded by generalized weakness. She is unable to provide any further information secondary to mental status. Caregiver is at bedside who denied any recent fevers, chills, cough, any difficulty eating. This was confirmed by her son via phone in the Emergency Department. CT head performed which revealed no acute findings.   REVIEW OF SYSTEMS: Difficult to obtain secondary to patient's mental status. Reviewed with caregiver. The patient has denied any recent constitutional. Denies any recent fevers.   The above review of systems, the ER physician noted left upper extremity weakness, which was confirmed by the caregiver and per the son. This was new, though she does have some residual left-sided neglect from her prior stroke.   Review of systems once again, difficult to obtain secondary to the patient's mental status, reviewed through patient's caregiver as well as sun.   CONSTITUTIONAL: No recent fevers, fatigue.  EYES: No changes in vision.  ENT: No oral lesions or difficulty swallowing.  RESPIRATIONS: No cough or shortness of breath.  CARDIOVASCULAR: No chest pain or palpitations.  GASTROINTESTINAL: No nausea, vomiting, diarrhea.  GENITOURINARY: Incontinent of urine. No dysuria.  ENDOCRINE: No thyroid problems, heat or cold intolerance.  HEMATOLOGIC/LYMPHATIC: No easy bruisability or bleeding.  SKIN: No rash or lesions.  MUSCULOSKELETAL: No pains or cramping.  NEUROLOGIC: Symptoms as described above.  PSYCHIATRIC: No anxiety or depression.   Otherwise full review of  systems was performed and is negative.   Has a past medical history of vascular dementia, previous CVA with residual left-sided neglect, though no history of residual weakness as well as diabetes, maintained on oral agents.   SOCIAL HISTORY: Currently resides at [facility]. Denies any alcohol, tobacco or drug usage.   FAMILY HISTORY: Per the son, no history of CVAs.   PHYSICAL EXAM:  VITAL SIGNS: Temperature 97.7, pulse 106, respirations 22, blood pressure 159/80. O2 saturations 96 on room air.  GENERAL: No acute distress. Awake, able to answer simple "yes" or "no" questions on occasion.  HEENT: Normocephalic, atraumatic. Extraocular muscles intact. Pupils are equal, round and reactive to light as well as accommodation. Moist mucosal membranes.  CARDIOVASCULAR: S1, S2, regular rate and rhythm. No murmurs, rubs, or gallops. PULMONARY: Clear to auscultation bilaterally, without wheezes, rales, or rhonchi.  ABDOMEN: Soft, nontender, nondistended, positive bowel sounds.  EXTREMITIES: No cyanosis, edema or clubbing.  NEUROLOGIC: Cranial nerves II-XII were intact. They were somewhat difficult to assess secondary to patient's mental status. She does have left-handed hand-grip weakness; otherwise full strength in all extremities. Sensation is intact.   LABORATORY DATA: Sodium 135, potassium 3.9, chloride 103, bicarb 27, BUN 27, creatinine 0.91, glucose 190, calcium 10.5, WBC 11.1, hemoglobin 12.3, platelets 316.   Urinalysis negative for signs of infection.   CT head performed revealed no acute intracranial abnormalities.   EKG normal sinus tachycardia, heart rate 109.   ASSESSMENT AND PLAN: A 74 year old [place] American female with a history of vascular dementia secondary to prior strokes as well as diabetes, presented with a 3-day duration of worsening mental status and generalized weakness, found to have left hand-grip  weakness, which is new.  1.  Encephalopathy, secondary to  probable cerebrovascular accident:  Initial CT head negative for acute findings. Will check an MRI in the morning. She is on Plavix, and today give simvastatin. Check a lipid panel for risk factor modification as well as an echocardiogram and carotid Dopplers; q.4 hour neurochecks. There appears to be no evidence of infection based on the labs. Would not be causing any of her problems. She was recently started on Haldol, which may be a confounding factor. Will obviously stop these medication.  2. Type 2 diabetes, noninsulin-requiring. Hold all agents and place on sliding-scale with Accu-Cheks.  3. Dementia.   The patient is on Aricept for DVT prophylaxis.   THE PATIENT IS FULL CODE as discussed with son, Michele Guerra, [phone].   Total time spent: Thirty-six minutes.   ____________________________ Cletis Athensavid K. Hower, MD dkh:dm D: 05/16/2013 00:16:00 ET T: 05/16/2013 08:43:53 ET JOB#: [id number]  cc: Cletis Athensavid K. Hower, MD, <Dictator> DAVID Synetta ShadowK HOWER MD ELECTRONICALLY SIGNED 05/25/2013 22:05

## 2015-01-05 NOTE — Discharge Summary (Signed)
PATIENT NAME:  Michele Guerra, Michele Guerra MR#:  [id number] DATE OF BIRTH:  July 19, 1941  DATE OF ADMISSION:  05/16/2013 DATE OF DISCHARGE:  05/19/2013  DISCHARGE DIAGNOSES: 1. Altered mental status secondary to worsening dementia, cerebrovascular accident is ruled out.  2. Hypertension.  3. Hyperlipidemia. 4. Diabetes mellitus type 2.  5. Severe dementia.   [facility]   CONSULTATIONS: None.   HOSPITAL COURSE: This is a 74 year old female patient with severe dementia, hypertension, diabetes, brought in because of worsening mental status. The patient is a poor historian. History is obtained from the caregiver. She is from a group home .She is admitted for possible stroke because of her worsening mental status and decreased left handgrip. The patient admitted for possible CVA. Initial workup showed CT head was negative for acute changes. The patient had an MRI of the brain along with carotid ultrasound and echocardiogram. MRI of brain did not show any strokes. Carotid ultrasound did not show any hemodynamically significant stenosis. Echo showed EF more than 65%. The patient not oriented to time, place, person, and has significant severe dementia. Seen by speech therapy. The patient recommended to have SNF. The patient also seen by speech therapy and physical therapy. Physical therapy recommended short-term physical therapy, recommended SNF and also speech therapy recommended. Current p.o. diet with nectar thick liquids and aspiration precautions, and she is taking medications in puree. However, her medications for diabetes are glitazones and metformin, and Amaryl was held. The patient's sugars have been around 100 to 120s. The plan is to continue NovoLog with coverage and hold oral medications because of her unstable p.o. intake. The patient is only on sliding scale coverage. Next, dementia. She is on Aricept.  The patient is a FULL CODE. Son is the power of attorney. His name is Michele Guerra. [place] [facility]   DISCHARGE MEDICATIONS AND DISCHARGE INSTRUCTIONS: She is on Haldol 0.5 mg p.o. b.i.Guerra., Aricept 10 mg daily, ferrous sulfate 50 mg 1 tablet daily, levothyroxine 20 mcg p.o. daily, vitamin B12 250 mcg p.o. daily, Plavix 75 mg p.o. daily, this was started because of previous stroke. The patient is also on lovastatin 40 mg p.o. daily, metoprolol 25 mg p.o. every 6 hours and NovoLog with sliding scale coverage.  VITALS AT THE TIME OF DISCHARGE: Today, blood pressure 123/75, heart rate 90, blood pressure as I mentioned. The patient's heart rate has been around 107 and 90s, so metoprolol is changed to every six hours, that can be cut down to twice daily once the heart rate drops to like 50s and that needs to be monitored very closely. The patient will be going to a nursing home.   Time spent on discharge preparation more than 30 minutes.    ____________________________ Katha HammingSnehalatha Eamonn Sermeno, MD sk:sg Guerra: 05/19/2013 09:53:00 ET T: 05/19/2013 10:14:09 ET JOB#: [id number]  cc: Katha HammingSnehalatha Johnella Crumm, MD, <Dictator> Katha HammingSNEHALATHA Torri Langston MD ELECTRONICALLY SIGNED 06/02/2013 23:17

## 2015-01-05 NOTE — H&P (Signed)
PATIENT NAME:  Michele Guerra, Michele Guerra MR#:  [id number] DATE OF BIRTH:  [birth year]  DATE OF ADMISSION:  05/16/2013  PRIMARY CARE PHYSICIAN:  Dr. Yehuda Mao.  REFERRING PHYSICIAN:  Dr. Carollee Massed.   This is a 74 year old African American female with a past medical history of vascular dementia, prior CVA, as well as diabetes, who presented to [facility] from [facility] nursing facility, with a 3-day duration of worsening mental status, compounded by generalized weakness. She is unable to provide any further information secondary to mental status. Caregiver is at bedside who denied any recent fevers, chills, cough, any difficulty eating. This was confirmed by her son via phone in the Emergency Department. CT head performed which revealed no acute findings.   REVIEW OF SYSTEMS: Difficult to obtain secondary to patient's mental status. Reviewed with caregiver. The patient has denied any recent constitutional. Denies any recent fevers.   The above review of systems, the ER physician noted left upper extremity weakness, which was confirmed by the caregiver and per the son. This was new, though she does have some residual left-sided neglect from her prior stroke.   Review of systems once again, difficult to obtain secondary to the patient's mental status, reviewed through patient's caregiver as well as sun.   CONSTITUTIONAL: No recent fevers, fatigue.  EYES: No changes in vision.  ENT: No oral lesions or difficulty swallowing.  RESPIRATIONS: No cough or shortness of breath.  CARDIOVASCULAR: No chest pain or palpitations.  GASTROINTESTINAL: No nausea, vomiting, diarrhea.  GENITOURINARY: Incontinent of urine. No dysuria.  ENDOCRINE: No thyroid problems, heat or cold intolerance.  HEMATOLOGIC/LYMPHATIC: No easy bruisability or bleeding.  SKIN: No rash or lesions.  MUSCULOSKELETAL: No pains or cramping.  NEUROLOGIC: Symptoms as described above.  PSYCHIATRIC: No anxiety or depression.   Otherwise full review of  systems was performed and is negative.   Has a past medical history of vascular dementia, previous CVA with residual left-sided neglect, though no history of residual weakness as well as diabetes, maintained on oral agents.   SOCIAL HISTORY: Currently resides at [facility]. Denies any alcohol, tobacco or drug usage.   FAMILY HISTORY: Per the son, no history of CVAs.   PHYSICAL EXAM:  VITAL SIGNS: Temperature 97.7, pulse 106, respirations 22, blood pressure 159/80. O2 saturations 96 on room air.  GENERAL: No acute distress. Awake, able to answer simple "yes" or "no" questions on occasion.  HEENT: Normocephalic, atraumatic. Extraocular muscles intact. Pupils are equal, round and reactive to light as well as accommodation. Moist mucosal membranes.  CARDIOVASCULAR: S1, S2, regular rate and rhythm. No murmurs, rubs, or gallops. PULMONARY: Clear to auscultation bilaterally, without wheezes, rales, or rhonchi.  ABDOMEN: Soft, nontender, nondistended, positive bowel sounds.  EXTREMITIES: No cyanosis, edema or clubbing.  NEUROLOGIC: Cranial nerves II-XII were intact. They were somewhat difficult to assess secondary to patient's mental status. She does have left-handed hand-grip weakness; otherwise full strength in all extremities. Sensation is intact.   LABORATORY DATA: Sodium 135, potassium 3.9, chloride 103, bicarb 27, BUN 27, creatinine 0.91, glucose 190, calcium 10.5, WBC 11.1, hemoglobin 12.3, platelets 316.   Urinalysis negative for signs of infection.   CT head performed revealed no acute intracranial abnormalities.   EKG normal sinus tachycardia, heart rate 109.   ASSESSMENT AND PLAN: A 74 year old [place] female with a history of vascular dementia secondary to prior strokes as well as diabetes, presented with a 3-day duration of worsening mental status and generalized weakness, found to have left hand-grip  weakness, which is new.  1.  Encephalopathy, secondary to  probable cerebrovascular accident:  Initial CT head negative for acute findings. Will check an MRI in the morning. She is on Plavix, and today give simvastatin. Check a lipid panel for risk factor modification as well as an echocardiogram and carotid Dopplers; q.4 hour neurochecks. There appears to be no evidence of infection based on the labs. Would not be causing any of her problems. She was recently started on Haldol, which may be a confounding factor. Will obviously stop these medication.  2. Type 2 diabetes, noninsulin-requiring. Hold all agents and place on sliding-scale with Accu-Cheks.  3. Dementia.   The patient is on Aricept for DVT prophylaxis.   THE PATIENT IS FULL CODE as discussed with son, Michele Guerra, [phone].   Total time spent: Thirty-six minutes.    ____________________________ Cletis Athensavid K. Hower, MD dkh:dm D: 05/16/2013 00:16:51 ET T: 05/16/2013 08:43:53 ET JOB#: [id number]  cc: Cletis Athensavid K. Hower, MD, <Dictator> DAVID Synetta ShadowK HOWER MD ELECTRONICALLY SIGNED 05/16/2013 23:52

## 2015-01-06 NOTE — H&P (Signed)
PATIENT NAME:  Michele Guerra, Michele Guerra MR#:  [id number] DATE OF BIRTH:  July 16, 1941  ADDENDUM:  For her urinary tract infection, I will add Rocephin and urine culture has been ordered.     ____________________________ Janyth ContesSital P. Juliene PinaMody, MD spm:LT Guerra: 06/13/2014 18:20:07 ET T: 06/13/2014 19:05:20 ET JOB#: [id number]  cc: Darian Cansler P. Juliene PinaMody, MD, <Dictator> Janyth ContesSITAL P Sundance Moise MD ELECTRONICALLY SIGNED 06/13/2014 20:47

## 2015-01-06 NOTE — Discharge Summary (Signed)
Dates of Admission and Diagnosis:  Date of Admission 01-Jun-2014   Date of Discharge 05-Jun-2014   Admitting Diagnosis ac renal failure   Final Diagnosis Ac renal failure- Urinary retention- need foley for now. Altered mental status- due to Hypernatremia- dehydration Htn Uti    Chief Complaint/History of Present Illness A 74 year old female patient with history of dementia recurrent urinary tract infection, aspiration pneumonia, hypertension, hyperlipidemia, diabetes, presents to the Emergency Room from skilled nursing facility with complaints of confusion. The patient has not been eating or drinking much, has been confused on thing. She was brought to the Emergency Room. Here she had to be given a dose of Geodon secondary to her agitation. Has been found to have sodium greater than 160 and also urinary retention with a Foley catheter training 1000 mg of urine. She also has acute renal failure and is being admitted to the hospitalist service.   The patient was last seen in the hospital in August 2015 with aspiration pneumonia, gram-negative rod urinary tract infection, with encephalopathy and discharged to skilled nursing facility. The patient continues to be a full code although she seems like somebody who could benefit from being on comfort measures.   Allergies:  Verapamil: Rash  Lotensin: Unknown  Ativan: Unknown  Haldol: Unknown  Hepatic:  17-Sep-15 18:35   Bilirubin, Total 0.2  Alkaline Phosphatase 106 (46-116 NOTE: New Reference Range 04/04/14)  SGPT (ALT) 28 (14-63 NOTE: New Reference Range 04/04/14)  SGOT (AST) 23  Total Protein, Serum 6.8  Albumin, Serum  2.9    20:10   Bilirubin, Total 0.2  Alkaline Phosphatase 111 (46-116 NOTE: New Reference Range 04/04/14)  SGPT (ALT) 28 (14-63 NOTE: New Reference Range 04/04/14)  SGOT (AST) 19  Total Protein, Serum 6.9  Albumin, Serum  3.1  Routine Micro:  17-Sep-15 20:10   Micro Text Report URINE CULTURE   COMMENT                    NO GROWTH IN 36 HOURS   ANTIBIOTIC                       Culture Comment NO GROWTH IN 36 HOURS  Result(s) reported on 03 Jun 2014 at 10:21AM.  Specimen Source INDWELLING CATHETER    21:48   Micro Text Report BLOOD CULTURE   COMMENT                   NO GROWTH AEROBICALLY/ANAEROBICALLY IN 5 DAYS   ANTIBIOTIC                       Culture Comment NO GROWTH AEROBICALLY/ANAEROBICALLY IN 5 DAYS  Result(s) reported on 06 Jun 2014 at 10:00PM.  Routine Chem:  17-Sep-15 18:35   Glucose, Serum  212  BUN  43  Creatinine (comp)  2.30  Sodium, Serum  > 160  Potassium, Serum  3.3  Chloride, Serum  > 128  CO2, Serum 29  Calcium (Total), Serum 9.0  Anion Gap SEE COMMENT  Osmolality (calc) SEE COMMENT  eGFR (African American)  23  eGFR (Non-African American)  20 (eGFR values <53m/min/1.73 m2 may be an indication of chronic kidney disease (CKD). Calculated eGFR is useful in patients with stable renal function. The eGFR calculation will not be reliable in acutely ill patients when serum creatinine is changing rapidly. It is not useful in  patients on dialysis. The eGFR calculation may not be applicable to patients at  the low and high extremes of body sizes, pregnant women, and vegetarians.)  Result Comment HEMOGRAM - SMEAR SCANNED  Result(s) reported on 01 Jun 2014 at 07:07PM.    20:10   Glucose, Serum  215  BUN  44  Creatinine (comp)  2.31  Sodium, Serum  > 160  Potassium, Serum 3.5  Chloride, Serum  > 128  CO2, Serum 31  Calcium (Total), Serum 9.4  Anion Gap SEE COMMENT  Osmolality (calc) SEE COMMENT  eGFR (African American)  23  eGFR (Non-African American)  20 (eGFR values <76m/min/1.73 m2 may be an indication of chronic kidney disease (CKD). Calculated eGFR is useful in patients with stable renal function. The eGFR calculation will not be reliable in acutely ill patients when serum creatinine is changing rapidly. It is not useful in  patients on dialysis. The  eGFR calculation may not be applicable to patients at the low and high extremes of body sizes, pregnant women, and vegetarians.)  Result Comment SODIUM - RESULTS VERIFIED BY REPEAT TESTING.  - NOTIFIED OF CRITICAL VALUE  - TO CMaliVANDIKE, ER AT 2115 06/01/2014  - BY TFK.  - READ-BACK PROCESS PERFORMED. ELECTROLYTES - RESULTS VERIFIED BY REPEAT TESTING. ANION GAP/OSMOLALITY - UNABLE TO CALCULATE VALUE DUE TO NON-  - NUMERIC VALUE WITHIN THE CALCULATION.  18-Sep-15 02:02   Glucose, Serum  205  BUN  39  Creatinine (comp)  2.06  Sodium, Serum  > 160  Potassium, Serum  2.9  Chloride, Serum  > 128  CO2, Serum 28  Calcium (Total), Serum 9.0  Anion Gap SEE COMMENT  Osmolality (calc) SEE COMMENT  eGFR (African American)  27  eGFR (Non-African American)  23 (eGFR values <668mmin/1.73 m2 may be an indication of chronic kidney disease (CKD). Calculated eGFR is useful in patients with stable renal function. The eGFR calculation will not be reliable in acutely ill patients when serum creatinine is changing rapidly. It is not useful in  patients on dialysis. The eGFR calculation may not be applicable to patients at the low and high extremes of body sizes, pregnant women, and vegetarians.)  Result Comment ANION GAP/OSMO - UNABLE TO CALCULATE VALUE DUE TO NON-  - NUMERIC VALUE WITHIN THE CALCULATION. SODIUM - NOTIFIED OF CRITICAL VALUE  - RESULTS VERIFIED BY REPEAT TESTING.  - CALLED TO BETH BUONO AT [place] 06/02/14  - READ-BACK PROCESS PERFORMED.  - TPL  Result(s) reported on 02 Jun 2014 at 02:32AM.    05:48   Glucose, Serum  263  BUN  38  Creatinine (comp)  1.81  Sodium, Serum  > 160  Potassium, Serum 3.6  Chloride, Serum  > 128  CO2, Serum 31  Calcium (Total), Serum 9.6  Anion Gap SEE COMMENT  Osmolality (calc) SEE COMMENT  eGFR (African American)  31  eGFR (Non-African American)  27 (eGFR values <6034min/1.73 m2 may be an indication of chronic kidney disease  (CKD). Calculated eGFR is useful in patients with stable renal function. The eGFR calculation will not be reliable in acutely ill patients when serum creatinine is changing rapidly. It is not useful in  patients on dialysis. The eGFR calculation may not be applicable to patients at the low and high extremes of body sizes, pregnant women, and vegetarians.)  Result Comment MAGNESIUM - Slight hemolysis, interpret results with  - caution.  Result(s) reported on 02 Jun 2014 at 10:01AM.  Magnesium, Serum 2.1 (1.8-2.4 THERAPEUTIC RANGE: 4-7 mg/dL TOXIC: > 10 mg/dL  -----------------------)    13:17  Glucose, Serum  267  BUN  33  Creatinine (comp)  1.90  Sodium, Serum  > 160  Potassium, Serum  3.1  Chloride, Serum  128  CO2, Serum 29  Calcium (Total), Serum 9.3  Anion Gap SEE COMMENT  Osmolality (calc) SEE COMMENT  eGFR (African American)  29  eGFR (Non-African American)  25 (eGFR values <65m/min/1.73 m2 may be an indication of chronic kidney disease (CKD). Calculated eGFR is useful in patients with stable renal function. The eGFR calculation will not be reliable in acutely ill patients when serum creatinine is changing rapidly. It is not useful in  patients on dialysis. The eGFR calculation may not be applicable to patients at the low and high extremes of body sizes, pregnant women, and vegetarians.)  Result Comment SODIUM - RESULTS VERIFIED BY REPEAT TESTING.  - NOTIFIED OF CRITICAL VALUE  - READ-BACK PROCESS PERFORMED.  - SA[place]9/18/15 @ 1359..Marland KitchenMarland KitchenTV ANION GAP/OSMOLALITY - UNABLE TO CALCULATE VALUE DUE TO NON-  - NUMERIC VALUE WITHIN THE CALCULATION.  Result(s) reported on 02 Jun 2014 at 01:43PM.  19-Sep-15 06:47   Glucose, Serum  214  BUN  25  Creatinine (comp)  1.71  Sodium, Serum  156  Potassium, Serum  3.4  Chloride, Serum  121  CO2, Serum 31  Calcium (Total), Serum 9.3  Anion Gap  4  Osmolality (calc) 320  eGFR (African American)  33  eGFR (Non-African  American)  29 (eGFR values <66mmin/1.73 m2 may be an indication of chronic kidney disease (CKD). Calculated eGFR is useful in patients with stable renal function. The eGFR calculation will not be reliable in acutely ill patients when serum creatinine is changing rapidly. It is not useful in  patients on dialysis. The eGFR calculation may not be applicable to patients at the low and high extremes of body sizes, pregnant women, and vegetarians.)  20-Sep-15 04:32   Glucose, Serum  175  BUN  20  Creatinine (comp) 1.26  Sodium, Serum  149  Potassium, Serum 3.5  Chloride, Serum  114  CO2, Serum 27  Calcium (Total), Serum 8.6  Anion Gap 8  Osmolality (calc) 303  eGFR (African American)  48  eGFR (Non-African American)  42 (eGFR values <6019min/1.73 m2 may be an indication of chronic kidney disease (CKD). Calculated eGFR is useful in patients with stable renal function. The eGFR calculation will not be reliable in acutely ill patients when serum creatinine is changing rapidly. It is not useful in  patients on dialysis. The eGFR calculation may not be applicable to patients at the low and high extremes of body sizes, pregnant women, and vegetarians.)  21-Sep-15 07:21   Glucose, Serum  203  BUN  19  Creatinine (comp) 1.25  Sodium, Serum 145  Potassium, Serum  3.4  Chloride, Serum  112  CO2, Serum 28  Calcium (Total), Serum 8.9  Anion Gap  5  Osmolality (calc) 297  eGFR (African American)  49  eGFR (Non-African American)  42 (eGFR values <52m58mn/1.73 m2 may be an indication of chronic kidney disease (CKD). Calculated eGFR is useful in patients with stable renal function. The eGFR calculation will not be reliable in acutely ill patients when serum creatinine is changing rapidly. It is not useful in  patients on dialysis. The eGFR calculation may not be applicable to patients at the low and high extremes of body sizes, pregnant women, and vegetarians.)  Cardiac:  17-Sep-15  18:35   Troponin I 0.02 (0.00-0.05 0.05 ng/mL or less: NEGATIVE  Repeat testing in 3-6 hrs  if clinically indicated. >0.05 ng/mL: POTENTIAL  MYOCARDIAL INJURY. Repeat  testing in 3-6 hrs if  clinically indicated. NOTE: An increase or decrease  of 30% or more on serial  testing suggests a  clinically important change)    20:10   Troponin I 0.02 (0.00-0.05 0.05 ng/mL or less: NEGATIVE  Repeat testing in 3-6 hrs  if clinically indicated. >0.05 ng/mL: POTENTIAL  MYOCARDIAL INJURY. Repeat  testing in 3-6 hrs if  clinically indicated. NOTE: An increase or decrease  of 30% or more on serial  testing suggests a  clinically important change)  Routine UA:  17-Sep-15 20:10   Color (UA) Yellow  Clarity (UA) Hazy  Glucose (UA) Negative  Bilirubin (UA) Negative  Ketones (UA) Negative  Specific Gravity (UA) 1.009  Blood (UA) Negative  pH (UA) 5.0  Protein (UA) Negative  Nitrite (UA) Negative  Leukocyte Esterase (UA) 2+ (Result(s) reported on 01 Jun 2014 at 08:37PM.)  RBC (UA) 4 /HPF  WBC (UA) 20 /HPF  Bacteria (UA) NONE SEEN  Epithelial Cells (UA) 4 /HPF  Hyaline Cast (UA) 1 /LPF (Result(s) reported on 01 Jun 2014 at 08:37PM.)  Routine Coag:  17-Sep-15 18:35   Activated PTT (APTT) 27.1 (A HCT value >55% may artifactually increase the APTT. In one study, the increase was an average of 19%. Reference: "Effect on Routine and Special Coagulation Testing Values of Citrate Anticoagulant Adjustment in Patients with High HCT Values." American Journal of Clinical Pathology 2006;126:400-405.)  Prothrombin 14.7  INR 1.2 (INR reference interval applies to patients on anticoagulant therapy. A single INR therapeutic range for coumarins is not optimal for all indications; however, the suggested range for most indications is 2.0 - 3.0. Exceptions to the INR Reference Range may include: Prosthetic heart valves, acute myocardial infarction, prevention of myocardial infarction, and  combinations of aspirin and anticoagulant. The need for a higher or lower target INR must be assessed individually. Reference: The Pharmacology and Management of the Vitamin K  antagonists: the seventh ACCP Conference on Antithrombotic and Thrombolytic Therapy. VCBSW.9675 Sept:126 (3suppl): [id number]. A HCT value >55% may artifactually increase the PT.  In one study,  the increase was an average of 25%. Reference:  "Effect on Routine and Special Coagulation Testing Values of Citrate Anticoagulant Adjustment in Patients with High HCT Values." American Journal of Clinical Pathology 9163;846:659-935.)    20:10   Activated PTT (APTT) 24.3 (A HCT value >55% may artifactually increase the APTT. In one study, the increase was an average of 19%. Reference: "Effect on Routine and Special Coagulation Testing Values of Citrate Anticoagulant Adjustment in Patients with High HCT Values." American Journal of Clinical Pathology 2006;126:400-405.)  Routine Hem:  17-Sep-15 18:35   WBC (CBC)  15.1  RBC (CBC) 4.90  Hemoglobin (CBC)  11.8  Hematocrit (CBC) 37.6  Platelet Count (CBC) 286  MCV  77  MCH  24.0  MCHC  31.3  RDW  15.9    20:10   WBC (CBC)  14.1  RBC (CBC) 5.04  Hemoglobin (CBC) 12.1  Hematocrit (CBC) 39.0  Platelet Count (CBC) 289 (Result(s) reported on 01 Jun 2014 at 09:35PM.)  MCV  77  MCH  24.0  MCHC  31.1  RDW  15.6  Segmented Neutrophils 55  Lymphocytes 23  Monocytes 12  Eosinophil 2  Diff Comment 1 POIKILOCYTOSIS  Diff Comment 2 MICROCYTES PRESENT  Diff Comment 3 STOMATOCYTES  Diff Comment 4 ANISOCYTOSIS  Bands 1  Variant Lymphocytes 7  Diff  Comment 5 TARGET CELLS  Diff Comment 6 NORMAL PLT MORPHOLGY  Result(s) reported on 01 Jun 2014 at 09:35PM.  18-Sep-15 02:02   WBC (CBC)  14.9  RBC (CBC) 5.13  Hemoglobin (CBC) 12.0  Hematocrit (CBC) 40.1  Platelet Count (CBC) 268 (Result(s) reported on 02 Jun 2014 at 03:23AM.)  MCV  78  MCH  23.4  MCHC  29.9  RDW  15.8   Segmented Neutrophils 69  Lymphocytes 20  Monocytes 9  Eosinophil 2  Diff Comment 1 ANISOCYTOSIS  Diff Comment 2 POIKILOCYTOSIS  Diff Comment 3 TARGET CELLS  Diff Comment 4 PLTS VARIED IN SIZE  Result(s) reported on 02 Jun 2014 at 03:23AM.  19-Sep-15 06:47   WBC (CBC)  18.8  RBC (CBC)  5.22  Hemoglobin (CBC) 12.6  Hematocrit (CBC) 40.1  Platelet Count (CBC) 256  MCV  77  MCH  24.1  MCHC  31.4  RDW  15.6  Neutrophil % 65.7  Lymphocyte % 19.0  Monocyte % 10.5  Eosinophil % 4.3  Basophil % 0.5  Neutrophil #  12.3  Lymphocyte # 3.6  Monocyte #  2.0  Eosinophil #  0.8  Basophil # 0.1 (Result(s) reported on 03 Jun 2014 at 07:26AM.)  21-Sep-15 07:21   WBC (CBC) 8.9  RBC (CBC) 4.67  Hemoglobin (CBC)  10.9  Hematocrit (CBC) 35.6  Platelet Count (CBC) 213  MCV  76  MCH  23.5  MCHC  30.7  RDW  15.7  Neutrophil % 51.2  Lymphocyte % 31.0  Monocyte % 11.1  Eosinophil % 6.2  Basophil % 0.5  Neutrophil # 4.5  Lymphocyte # 2.7  Monocyte #  1.0  Eosinophil # 0.6  Basophil # 0.0 (Result(s) reported on 05 Jun 2014 at 07:57AM.)   PERTINENT RADIOLOGY STUDIES: XRay:    17-Sep-15 19:22, Chest Portable Single View  Chest Portable Single View   REASON FOR EXAM:    AMS  COMMENTS:       PROCEDURE: DXR - DXR PORTABLE CHEST SINGLE VIEW  - Jun 01 2014  7:22PM     CLINICAL DATA:  74 year old female with altered mental status.    EXAM:  PORTABLE CHEST - 1 VIEW    COMPARISON:  04/26/2014 and 05/15/2013 chest radiographs    FINDINGS:  The cardiomediastinal silhouette is unremarkable. There is no  evidence of focal airspace disease, pulmonary edema, suspicious  pulmonary nodule/mass, pleural effusion, or pneumothorax. No acute  bony abnormalitiesare identified.     IMPRESSION:  No active disease.      Electronically Signed    By: Hassan Rowan M.D.    On: 06/01/2014 19:51         Verified By: Lura Em, M.D.,  LabUnknown:    17-Sep-15 19:14, CT Head Without Contrast   PACS Image     17-Sep-15 19:22, Chest Portable Single View  PACS Image   CT:    17-Sep-15 19:14, CT Head Without Contrast  CT Head Without Contrast   REASON FOR EXAM:    ams  COMMENTS:       PROCEDURE: CT  - CT HEAD WITHOUT CONTRAST  - Jun 01 2014  7:14PM     CLINICAL DATA:  Altered mental status    EXAM:  CT HEAD WITHOUT CONTRAST    TECHNIQUE:  Contiguous axial images were obtained from the base of the skull  through the vertex without intravenous contrast.    COMPARISON:  Brain CT 05/15/2013  FINDINGS:  Ventricles and sulci are  prominent compatible with atrophy.  Extensive periventricular and subcortical white matter hypodensity  compatible with chronic small vessel ischemic change. No evidence  for acute cortically based infarct, mass lesion, mass effect or  hemorrhage. The calvarium is intact. Mastoid air cells are  unremarkable. Paranasal sinuses are well aerated. Small amount of  fluid within the frontal sinus.     IMPRESSION:  No acute intracranial process.    Extensive subcortical and periventricular white matter hypodensities  compatible with chronic small vessel ischemic change.  Electronically Signed    By: Lovey Newcomer M.D.    On: 06/01/2014 19:41         Verified By: Ilsa Iha, M.D.,   Pertinent Past History:  Pertinent Past History 1. Severe dementia.  2. Dysphagia.  3. Hypertension.  4. Diabetes.  5. Recurrent urinary tract infection.  6. Hypothyroidism.   Hospital Course:  Hospital Course 73y/oF with dementia, High Blood Pressure (Hypertension), DIABETES MELLITUS admitted from [facility] with AMS  * Metabolic encpehalopathy- from Urinary Tract Infection and mostly from hypernatremia IV D5, free water deficit.  monitor BMP daily- today Na 145. more alert today- appears to be baseline now.  * ARF- with urinary retention, and dehydration, foley placed IV fluids and monitor- improving avoid nephrotoxins    * urinary retention-  tried removing foley- but she had 800 ml residual urine multiple times- so replaced.   Flomax- and d/c to NH with foley.  * Hypo[place]lem[place]- being replaced  * UTI- not impressive, but no other source- continue rocpehin for now, Cx negatvie,  * HTN- meds were held due to her AMS on admisison- restarted metoprolol.  * Dementia- aricept, geodon prn for agitation d/c to NH today.   Condition on Discharge Stable   DISCHARGE INSTRUCTIONS HOME MEDS:  Medication Reconciliation: Patient's Home Medications at Discharge:     Medication Instructions  clopidogrel 75 mg oral tablet  1 tab(s) orally once a day   lovastatin 40 mg oral tablet  1 tab(s) orally once a day   lopressor 50 mg oral tablet  1 tab(s) orally 2 times a day   vitamin b-12 250 mcg oral tablet  1 tab(s) orally once a day   ferrousal  50 milligram(s) orally once a day   calcium 600+d 600 mg-200 units oral tablet  1 tab(s) orally once a day   vitamin d3 1000 intl units oral tablet  1 tab(s) orally once a day   durezol 0.05% ophthalmic emulsion  1 drop(s) to left eye 2 times a day   ilevro 0.3% ophthalmic suspension  1 drop(s) to left eye once a day   methimazole 10 mg oral tablet  1 tab(s) orally once a day   losartan 100 mg oral tablet  1 tab(s) orally once a day   isosorbide mononitrate 60 mg oral tablet, extended release  1 tab(s) orally once a day   aricept 10 mg oral tablet  1 tab(s) orally once a day (at bedtime)   tradjenta 5 mg oral tablet  1 tab(s) orally once a day (at bedtime)   tamsulosin 0.4 mg oral capsule  1 cap(s) orally once a day (after a meal)   ziprasidone 20 mg oral capsule  1 cap(s) orally 1 to 2 times a day, As Needed, agitation , As needed, agitation   ceftin 500 mg oral tablet  1 tab(s) orally 2 times a day x 2 days    STOP TAKING THE FOLLOWING MEDICATION(S):    hydralazine 50  mg oral tablet: 1 tab(s) orally 2 times a day  Physician's Instructions:  Treatments Foley to leg bag  try to remove foley in  few days and check for residual  urine in bladder.   Diet Low Sodium   Activity Limitations As tolerated   Return to Work Not Applicable   Time frame for Follow Up Appointment 1-2 weeks   Other Comments with PMD- check Renal function and sodium in 2 weeks.     Denton Lank (Sallie)(Family Physician): [facility], [address] [place], [place], [place] [address], [place]  Electronic Signatures: Vaughan Basta (MD)  (Signed 23-Sep-15 08:20)  Authored: ADMISSION DATE AND DIAGNOSIS, CHIEF COMPLAINT/HPI, Allergies, PERTINENT LABS, PERTINENT RADIOLOGY STUDIES, PERTINENT PAST HISTORY, HOSPITAL COURSE, DISCHARGE INSTRUCTIONS HOME MEDS, PATIENT INSTRUCTIONS, Follow Up Physician   Last Updated: 23-Sep-15 08:20 by Vaughan Basta (MD)

## 2015-01-06 NOTE — Discharge Summary (Signed)
PATIENT NAME:  Michele Guerra, Laylani D MR#:  [id number] DATE OF BIRTH:  1941-01-30  ADMISSION DIAGNOSES:  1.  Urinary tract infection with acute cystitis.  No mention of hematuria.  2.  Sepsis.   DISCHARGE DIAGNOSES: 1.  Sepsis secondary to urinary tract infection with extended spectrum beta-lactamase.  2.  Extended spectrum beta-lactamase urinary tract infection, no hematuria.  3.  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  4.  History of hypothyroidism.  5.  Insulin-dependent diabetes.  6.  History of essential hypertension.   CONSULTATIONS:  Palliative Care.  PERTINENT LABORATORIES AT DISCHARGE: White blood cells 10.5, hemoglobin 8.2, hematocrit 25.8, platelets of 558,000. Sodium 146, potassium 3.9, chloride 107, bicarbonate 33, BUN 36, creatinine 0.71, glucose 75.   Urine culture is positive for ESBL.  Blood cultures, no growth.   DISCHARGE PHYSICAL EXAMINATION: VITAL SIGNS: Temperature is 98.4, pulse is 61, respirations 18, blood pressure 123/58, 100% on room air.  GENERAL: The patient has anoxic brain injury, does not speak at baseline.  She does withdraw to pain and localized pain.  CARDIOVASCULAR: Regular rate and rhythm. No murmur, gallops, or rubs. PMI was not displaced.  LUNGS: Clear to auscultation bilaterally without any crackles, rales, or rhonchi. Normal chestchest expansion ABDOMEN: Bowel sounds are positive. Nontender. She has a feeding tube.  EXTREMITIES: No clubbing, cyanosis or edema.   NEUROLOGIC:  patient has anoxic brain injury,  HOSPITAL COURSE:  A 74 year old female who presented with sepsis due to  urinary tract infection from nursing home. For further details, please refer to the H and P. 1.  Sepsis. The patient was admitted with elevated temperature, heart rate and leukocytosis. Blood pressure was stable at that time.  Her sepsis is secondary to ESBL UTI.  2.  ESBL urinary tract infection.  The patient has had ESBL in the past. She was placed on vancomycin and ertapenem.  Urine culture did come back positive for ESBL.  She needs a PICC line she will need 2 weeks of antibiotics.  Blood culture negative to date.  3.  Hypernatremia.  The patient's sodium has improved.  4.  Anoxic brain injury. The patient will continue with PEG feeds and she has a tracheostomy.  Palliative care was consulted. The patient continues to be full code status.  5.  Hypothyroidism on Synthroid.  6.  Essential hypertension.  Blood pressure is acceptable on hydralazine and Coreg.  7.  History of coronary artery disease on Plavix.  8.  History of hyperlipidemia. The patient will continue with her statin medication.   DISCHARGE MEDICATIONS: 1.  Lovenox 40 mg 1 injectable daily.  2.  Multivitamin daily.  3.  Levemir 35 units b.i.d.  4. Cholecalciferol 50,000 international units weekly on Wednesdays.  5.  Ascorbic acid 500 mg b.i.d.  6.  Coreg 12.5 b.i.d.  7.  Flomax 0.4 mg daily.  8.  Lipitor 10 mg at bedtime.  9.  Plavix 75 mg daily.  10.  Methimazole 5 mg 3 tablets q. 8 hours.  11.  Keppra 750 mg p.o. b.i.d.  12.  Hydralazine 25 mg t.i.d. 13.  Pepcid 20 mg b.i.d.  14.  Ferrous sulfate 5 mL daily.  15.  Peridex 15 mL b.i.d.  16.  Magnesium 400 mg b.i.d.  17.  Invanz 1 gram IV q. 24 hours x 11 days.  18.  Glucerna 1.5 RTH at 44 mL an hour with free water flushes every hour.  DISCHARGE DIET: ADA diet with Glucerna 1.5 RTH 44 mL an hour. Continue flush with 40 mL an hour.   DISCHARGE ACTIVITY: As tolerated.   DISCHARGE COMMENTS:  The patient will need her PICC removed after completion of antibiotics.   TIME SPENT: 47 minutes.   CONDITION:  The patient was stable for discharge once her PICC has been placed.    ____________________________ Sorin Frimpong P. Juliene Pina, MD spm:DT D: 08/01/2014 12:52:00 ET T: 08/01/2014 13:50:00 ET JOB#: [id number]  cc: Frederik Standley P. Juliene Pina, MD, <Dictator> Teena Irani. Terance Hart,  MD  Janyth Contes Dominik Lauricella MD ELECTRONICALLY SIGNED 08/01/2014 21:06

## 2015-01-06 NOTE — Consult Note (Signed)
PATIENT NAME:  Michele Guerra, Michele Guerra MR#:  [id number] DATE OF BIRTH:  March 13, 1941  DATE OF CONSULTATION:  04/26/2014  REFERRING PHYSICIAN:  Dr. Nemiah CommanderKalisetti. CONSULTING PHYSICIAN:  Michele MainlandDavid P. Sampson GoonFitzgerald, MD  REASON FOR CONSULTATION: Persistent leukocytosis.   HISTORY OF PRESENT ILLNESS: This is a 74 year old female with a history of dementia, hypertension and diabetes who resides at [facility]. She was sent over for increasing agitation and was found to have a likely urinary tract infection. She has been treated with IV antibiotics since admission; however, she has had persistent, quite elevated white count. She has had negative blood cultures. She has not had any high fevers. Her mental status seems to have improved to her baseline.   Per my discussion with the nurse, patient has had a poor appetite and has been pocketing her food in her mouth. She has not had any respiratory distress. She has not had any diarrhea. In fact, has only had 1 small bowel movement since admission. No bed sores are noted.   PAST MEDICAL HISTORY:  1.  Severe dementia.  2.  Hypertension.  3.  Hyperlipidemia.  4.  Type 2 diabetes.   PAST SURGICAL HISTORY:  1.  Cataract repair, third on the left eye.  2.  Lumbar spinal surgery.   SOCIAL HISTORY: Resides at [facility] nursing home. No tobacco, alcohol or drugs. Her son is her power of attorney.   FAMILY HISTORY: Noncontributory.   REVIEW OF SYSTEMS: Unable to be obtained.   ALLERGIES: VERAPAMIL, LOTENSIN, ATIVAN, AND HALDOL.   ANTIBIOTICS SINCE ADMISSION: Include ceftriaxone from August 7 through current.   PHYSICAL EXAMINATION:  VITAL SIGNS: Temperature is 98.2, pulse 97, blood pressure 194/96, respirations 20, saturations 96% on room air.  GENERAL: She is thin, lying in bed in no acute distress. She is able to converse, but is not lucid. Her pupils are equal, round, reactive to light and accommodation. Extraocular movements are intact. Sclerae anicteric.   Oropharynx is clear. Mucous membranes are somewhat dry. There is no thrush.  NECK: Supple. No anterior cervical, posterior cervical or supraclavicular lymphadenopathy.  HEART: Regular.  LUNGS: Clear to auscultation bilaterally.  ABDOMEN: Soft, mildly distended. Questionable mildly tender to palpation, although it is difficult to gauge.  EXTREMITIES: No clubbing, cyanosis or edema.  NEUROLOGIC: She is awake and interactive but demented. She is able to move all four extremities.  MUSCULOSKELETAL: No note joint swelling or erythema.   LABORATORY DATA: White blood count on admission was 18.7. It peaked on August 11 of 46,000.  It has come down to 35.6, hemoglobin 11.3, platelets 252,000.  Differential has shown predominantly neutrophilic response. Urinalysis done on admission reveals 112 white cells. Urine culture is growing greater than 100,000 gram-negative rods, so makes no further identification was done. Blood cultures x 2 on August 7 are negative. TSH was slightly low at 0.381. LFTs on admission showed a slightly elevated alkaline phosphatase of 148. Renal function is normal with a creatinine of 1.05 down from 1.76 on August 8.    IMAGING: No imaging is available.   IMPRESSION: A 74 year old with advanced dementia admitted with a urinary tract infection with persistent leukocytosis up to 46,000. She has not had any fevers or been hemodynamically unstable. She is being worked up for possible cerebrovascular accident due to some left-sided weakness.  There is no imaging available. Her urine culture was not speciated, although she did have greater than 100,000 gram-negative rods. She has been on ceftriaxone since admission. Blood cultures are negative.  I suspect that she has grown a resistant organism that we are not treating in her urine or she has aspiration pneumonia or an intra-abdominal process given the distention.   RECOMMENDATIONS:  1.  Change ceftriaxone to the Zosyn.  2.  Repeat  urinalysis and urine culture with an in and out catheter.  3.  Check a chest x-ray, as well as a 2-view of the abdomen to evaluate for any evidence of aspiration or abdominal distention.  4.  Continue to work-up for potential CVAs with an MRI that is pending.  5.  If she develops diarrhea, which suggests Clostridium difficile.   Thank you for the consult. I would be glad to follow with you.    ____________________________ Michele Mainland. Sampson Goon, MD dpf:TT Guerra: 04/26/2014 15:45:32 ET T: 04/26/2014 16:32:59 ET JOB#: [id number]  cc: Michele Mainland. Sampson Goon, MD, <Dictator> Demarko Zeimet Sampson Goon MD ELECTRONICALLY SIGNED 05/05/2014 22:24

## 2015-01-06 NOTE — H&P (Signed)
PATIENT NAME:  Candise CheHARRIS, Olia D MR#:  [id number] DATE OF BIRTH:  [birth year]  DATE OF ADMISSION:  06/13/2014  PRIMARY CARE PHYSICIAN: Hillery AldoSarah Patel, MD  CHIEF COMPLAINT: Status epilepticus and malignant hypertension.   HISTORY OF PRESENT ILLNESS: This is a 74 year old female who was discharged on the 21st of September with urinary tract infection and hypernatremia who presents from her nursing home with status epilepticus. Apparently, the patient had a seizure for over 20 minutes. EMS was called. She received 4 Versed, still had status epilepticus when she arrived to the ER. Dr. Mayford KnifeWilliams in the Emergency Department intubated the patient due to inability to protect airway and ongoing seizure. He started her on Keppra and her seizures resolved as she was coming out of the CAT scan. The patient is now intubated, sedated on the vent.   REVIEW OF SYSTEMS: Unable to obtain as the patient is intubated, sedated on the vent.  PAST MEDICAL HISTORY: Per the medical chart. 1.  The patient was admitted 09/17 through 06/05/2014 with hypernatremia and urinary symptoms with UTI.  2.  Severe dementia. 3.  Dysphagia. 4.  Hypertension. 5.  Diabetes. 6.  Recurrent UTIs. 7.  Hyperthyroidism.  SOCIAL HISTORY: According to medical chart, no tobacco, alcohol or IV drug use. The patient is from a skilled nursing facility.   ALLERGIES: According to medical chart, ATIVAN, HALDOL, LOTENSIN,  AND VERAPAMIL.   MEDICATIONS: 1.  Geodon 20 mg one to two tablets p.r.n. agitation.  2.  Vitamin D3 1000 international units daily.  3.  B12 250 mg daily.  4.  Tradjenta 5 mg at bedtime. 5.  Tamsulosin 0.4 mg daily.  6.  Novolin sliding scale insulin.  7.  Methimazole 10 mg daily.  8.  Metformin 1000 mg b.i.d.  9.  Lovastatin 40 mg daily.  10.  Losartan 100 mg daily.  11.  Lopressor 50 mg b.i.d.  12.  Imdur mononitrate 30 mg daily.  13.  Ilevro ophthalmic solution 1 drop to left eye. 14.  Ferrosol 50 mg daily.  15.   Durezol 0.05% 1 drop to the left eye b.i.d.  16.  Plavix 75 mg daily.  17.  Calcium plus D 1 tablet daily.  18.  Aricept 10 mg daily.   PAST SURGICAL HISTORY: None per medical chart.   PHYSICAL EXAMINATION: VITAL SIGNS: Temperature 97.5, pulse 105, respirations 18, blood pressure 188/104, but it should be noted that the patient arrived with a blood pressure of 240 systolically and actually her current blood pressure systolically is 204. Sat 100% on ventilator. GENERAL: The patient is intubated, sedated on the vent.  HEENT: Pupils are 3 mm, very sluggish. Oropharynx is not inspected as the patient is intubated.  NECK: Supple. No appreciable enlarged thyroid.  HEART: Tachycardia. She has a 2/6 systolic ejection murmur heard best at the left sternal border. No radiation. PMI is laterally displaced.  LUNGS: Clear to auscultation without any obvious crackles, rales, rhonchi, or wheezing. This was auscultated anteriorly. ABDOMEN: Bowel sounds are positive. Nontender, nondistended. No hepatosplenomegaly. No rebound or guarding noted.  EXTREMITIES: No clubbing, cyanosis or edema. The patient is very thin.  NEUROLOGIC: Unable to do a good neuro exam as the patient is intubated and sedated. Her Babinski sign is downgoing. She has some mild clonus in the right lower extremity.  SKIN: Without any obvious rash or lesions.   DIAGNOSTIC DATA: White blood cell 10.3, hemoglobin 11.8, hematocrit 37, platelets 303,000. Sodium 140, potassium 4.5, chloride 104, bicarb 24, BUN 12,  creatinine 1.28, glucose 176, calcium 8.6. ALT 45, AST 35, total protein 6.9, albumin 3. Troponin less than 0.02. INR 1.  Alcohol level less than 3 Urinalysis shows 3+ LCE with 178 white blood cells. Urine toxicology is negative.   Chest x-ray shows no acute cardiopulmonary disease.   CT of the head and CT of the cervical spine without any acute fractures or acute intracranial hemorrhage.   EKG: Sinus tachycardia. No St elevations or  depressions.   ASSESSMENT AND PLAN: A 74 year old female brought in from the nursing home with over 30 minutes of status epilepticus and malignant hypertension.  1.  Status epilepticus. The patient does not have a history of seizures. I suspect this could be related to her malignant hypertension as her blood pressure is greater than 240 systolically. Her CAT scan of the head does not have acute findings at this time. She will be placed on seizure precautions. I will ask the neurologist to see the patient in consultation. I have ordered an EEG. I will leave the MRI up to discussion of the neurologist. She is currently on propofol for sedation for her respiratory failure but may not be a good agent given her seizures.  2.  Acute respiratory failure secondary to inability to protect airway from her status epilepticus. Chest x-ray without any acute findings. Pulmonary has been consulted. We will continue vent management for now. We will try to wean the ventilator tonight or tomorrow. I will obtain ABG for tomorrow. 3.  Malignant hypertension, may be the etiology of her seizures. I have started on labetalol drip for now. We will continue to monitor. May restart outpatient medications once her blood pressure is better controlled.  4.  History of severe dementia. Hold any medications at this time as the patient is intubated and sedated on the vent.  5.  Hyperthyroidism. Will check a TSH and hold p.o. medications.  6.  Diabetes. I have placed the patient on sliding scale insulin for now. We will monitor her blood sugars. At this point, I do not think she needs an insulin drip as I feel like she probably will be off the ventilator within the next 24 hours.   The patient is FULL code status. We will consult palliative care as the patient has had several hospitalizations within the past several months.  CRITICAL CARE TIME SPENT: 65 minutes. ____________________________ Janyth Contes. Juliene Pina, MD spm:sb D: 06/13/2014  14:49:47 ET T: 06/13/2014 15:46:24 ET JOB#: [id number]  cc: Hue Steveson P. Juliene Pina, MD, <Dictator> Janyth Contes Dary Dilauro MD ELECTRONICALLY SIGNED 06/13/2014 18:24

## 2015-01-06 NOTE — Consult Note (Signed)
   Comments   Spoke with [facility] and son, mark, by phone.  [facility] RB states that since last hospitalization pt has not returned to baseline.  Pt continues to be need help with all ADL's and does not wheel herself around the unit as much in her wheelchair.  This is the specific change RN spoke about.  Pt until the last 2 days was eating 30-50 % of meals and drinking all fluids.  Last two days pt has had minimal intake and has had AMS. Mark, updated on pt's status.  Son feels this is an acute event and wishes to be updated periodically to discuss GOC.  Son states that he wishes pt to be a full code and does not want to discuss this currently.  Son states it is ok to readress code status as we watch pt's condition.  Son states he wants to be notified of any changes to status.  Electronic Signatures: Norvell Caswell, Jaquelyn BitterStephen J (NP)  (Signed 18-Sep-15 11:06)  Authored: Palliative Care Phifer, Harriett SineNancy (MD)  (Signed 18-Sep-15 14:03)  Authored: Palliative Care   Last Updated: 18-Sep-15 14:03 by Phifer, Harriett SineNancy (MD)

## 2015-01-06 NOTE — Discharge Summary (Signed)
PATIENT NAME:  Michele Guerra, Michele Guerra MR#:  [id number] DATE OF BIRTH:  06/13/1941  DATE OF ADMISSION:  04/22/2014 DATE OF DISCHARGE:  04/28/2014  ADMISSION DIAGNOSIS: Urinary tract infection.   DISCHARGE DIAGNOSES: 1.  Altered mental status with metabolic encephalopathy from urinary tract infection. 2.  Urinary tract infection.  3.  Aspiration pneumonia.  4.  Malignant hypertension.  5.  Ileus.  6.  Diabetes.  7.  Hypothyroidism.  8.  Recent left eye cataract surgery.  9.  Electrolyte deficiency.   CONSULTATIONS: Dr. Sampson Goon.   PERTINENT LABORATORIES: Magnesium 1.5. White blood cells 21, hemoglobin 10, hematocrit 31, platelets 295,000.   HOSPITAL COURSE: This is a very pleasant 74 year old female with a history of dementia, diabetes and high blood pressure who presented with agitation and confusion, worse than baseline. For further details, please refer to the H and P.  1.  Altered mental status secondary to metabolic encephalopathy secondary to urinary tract infection. There was a concern for neurological event so I ordered a MRI. The MRI was negative for CVA. It did show some obstructing lesion within the left nasal cavity, which she will need ENT follow-up for. She was initially started on Rocephin; however, white blood cell count continued to be increased so the urinary tract infection was probably resistant to Rocephin. Urine cultures at that time were negative to date. ID consultation was placed. They recommended broad-spectrum antibiotics including Zosyn. Her white blood cell count has improved. She was afebrile throughout her hospitalization. Repeat urine culture is actually also negative. She was continued on her outpatient medications for her dementia.  2.  Urinary tract infection. Her cultures were growing out mixed bacteria initially and repeat urine cultures were negative to date and as mentioned she had some elevated white blood cell count on Rocephin. This was changed to Zosyn and  her white blood cell count seems to be responding to this. Blood cultures are negative to date.  3.  Aspiration pneumonia. On chest x-ray it appears that she has had aspiration pneumonia. Unclear if this was present on admission, but likely was present on admission. She did not have a chest x-ray on admission. She was placed on Augmentin and Cipro at discharge.  4.  Malignant hypertension. Blood pressure was not controlled well in the hospital. We made some adjustments, added some medications. Blood pressure is better controlled.  5.  Ileus. Abdominal x-ray was consistent with ileus. She did have slightly distended abdomen. On discharge, her bowel sounds are positive. She has no distention in her abdomen. She is eating her diet.  6.  Hypothyroidism. She will continue her own Tapazole. Free T4 was normal.  7.  Recent left eye cataract surgery. The patient is on eyedrops.   DISCHARGE MEDICATIONS: 1.  Plavix 75 mg daily.  2.  Lovastatin 40 mg daily.  3.  Aricept 5 mg at bedtime.  4.  Tradjenta 5 mg daily. 5.  Lopressor 50 mg b.i.d.  6.  Vitamin B12 250 mg daily.  7.  FerrouSul 50 mcg daily.  8.  Calcium 1 tablet daily.  9.  Vitamin D3 1000 international units daily. 10.  Durezol 0.5% ophthalmic emulsion left eye b.i.d.  11.  Vigamox 0.5 ophthalmic solution 4 times a day.  12.  Ilevro 0.3 ophthalmic solution 1 drop left eye daily.  13.  Methimazole 10 mg daily.  14.  Losartan 100 mg daily.  15.  Imdur 60 mg daily.  16.  Augmentin 500 mg b.i.d. for 7 days, stop  on 08/23. 17.  Ciprofloxacin 500 mg q. 12 hours x7 days, stop 08/23. 18.  Hydralazine 50 mg b.i.d.   DISCHARGE DIET: Pureed thin liquids, strict aspiration precautions, meds in puree, crush as able, feeding assistance at all meals, low sodium diet.   DISCHARGE ACTIVITY: As tolerated.   DISCHARGE REFERRAL: Physical therapy.   The patient was medically stable for discharge.   TIME SPENT: 40 minutes.    ____________________________ Janyth ContesSital P. Juliene PinaMody, MD spm:sb D: 04/28/2014 08:54:33 ET T: 04/28/2014 09:11:03 ET JOB#: [id number]  cc: Bianco Cange P. Juliene PinaMody, MD, <Dictator> Janyth ContesSITAL P Sonya Gunnoe MD ELECTRONICALLY SIGNED 04/28/2014 14:06

## 2015-01-06 NOTE — Discharge Summary (Signed)
PATIENT NAME:  Michele Guerra, Michele Guerra MR#:  [id number] DATE OF BIRTH:  [birth year]  DATE OF ADMISSION:  06/13/2014 DATE OF DISCHARGE:  06/20/2014  ADMITTING DIAGNOSIS: Seizure, status epilepticus.   DISCHARGE DIAGNOSES:  1.  Status epilepticus.   2.  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  3.  Acute respiratory failure due to inability to protect airways, due to encephalopathy, status post 2 attended extubations on the 2nd as well as on 06/19/2014, failed to maintain airways, reintubated a few hours later during each attempt.   4.  Malignant hypertension.  5.  Advanced dementia.  6.  Clostridium difficile  colitis, new diagnosis.   7.  History of recurrent urinary tract infections.   8.  Dysphagia.   9.  Hypertension.   10.  Diabetes mellitus.   11.  Hyperthyroidism.  12.  Right peripherally inserted central catheter line placement on 06/16/2014.   DISCHARGE CONDITION: Guarded.   DISCHARGE MEDICATIONS: The patient is to continue Plavix 75 mg p.o. daily, lovastatin 40 mg p.o. daily, vitamin B12 250 mcg p.o. daily, ferrousal 50 mg once daily, calcium with vitamin Guerra 600/200 one tablet once daily, vitamin D3 1000 units once daily, Durezol 0.05% ophthalmic emulsion 1  drop to left eye twice daily, Ilevro 0.3% ophthalmic suspension 1 drop to left eye once daily, methimazole 10 mg p.o. daily, Aricept 10 mg at bedtime, prednisone 50 mg p.o. once on 06/21/2014 and then taper x 10 mg daily until stopped, Flagyl 500 mg every 8 hours for 13 more days to complete a 2 week course, Keppra 750 mg IV daily, lacosamide 100 mg IV daily, insulin sliding scale, famotidine 10 mg orally daily, Vital High Protein RTH 35 mL orogastric tube continuous infusion for tube feeds, flushes with 75 mL of water every hour.    CONSULTANTS:  Care management, social work, Dr. Loretha BrasilZeylikman neurology, Dr. Belia HemanKasa intensive care pulmonary, Mr. Tonie GriffithStephen Mantzouris, NP, palliative care, Dr. Freda MunroSaadat Khan  pulmonary intensive care, Dr. Mellody DrownMatthew Smith neurology, Dr. Harvie JuniorPhifer palliative care.   RADIOLOGIC STUDIES: Chest portable single view 06/13/2014 revealing ET tube 3.4 cm above the carina, low volume exam, vascular congestion, and atelectasis. CT scan of cervical spine without contrast 06/13/2014 showing small scalp hematoma at the vertex, no calvarium fracture, no acute intracranial abnormality, no acute fracture or listhesis identified in the cervical spine, ligamentous injury was not excluded, degenerative multilevel cervical spine stenosis most pronounced at C3-C4, moderate, intubated and oral ET tube in place, visible tube placement is satisfactory. CT of head without contrast on 06/13/2014 revealing the same as above. Chest x-ray portable single view 06/15/2014 revealed improved vascular congestion, ET and nasogastric tubes remained in satisfactory position. Repeated chest x-ray portable single view 06/16/2014 after PICC line placement showed right mainstem intubation, these results were called to nurse who acknowledged results, new right upper extremity PICC line with the tip at the junction of superior vena cava and the right atrium. CT of head without contrast 06/16/2014 revealed unchanged findings of advanced atrophy and microvascular ischemic disease without acute intracranial process, apparent resolution of previously noted right vertex scalp hematoma. Chest x-ray portable single view 06/18/2014 revealed change in position in the ET tube as described above with tip 1 cm above the carina. Chest portable single view x-ray 06/19/2014 revealed right mainstem intubation, they recommended retraction of ET tube approximately 3.5-4 cm, critical value was called to nurse who verbally acknowledged these results. Repeated chest x-ray 06/19/2014 after tube replacement revealed interim slide as  well of ET tube, its tip at 1 cm above the carina, slide of proximal repositioning should be considered, PICC line in stable  condition with basal atelectasis and/or mild infiltrates, small left pleural effusion could not be excluded.   HISTORY OF PRESENT ILLNESS: The patient is a 74 year old African-American female with history of dementia, who presented to the hospital on 06/13/2014 with a seizure. Please refer to Dr. Camillia Herter admission note on 06/13/2014. On arrival to the hospital the patient's  temperature was 97.5, pulse was 105, respiration rate was 18, blood pressure 188/104, initially at 240 systolically, and O2 saturations were 100% on ventilator.  Pupils were 3 mm, sluggish. The patient was intubated. The patient did have mild systolic ejection murmur at the left sternal border, PMI was laterally displaced, otherwise no significant abnormalities were noted.  Mild coolness of the right lower extremity was noted. Babinski was downgoing. The patient's laboratory data done on arrival 06/13/2014 showed elevated glucose level to 176, otherwise BMP was unremarkable. The patient's liver enzymes revealed albumin level of 3.0. The patient's cardiac enzymes, troponin was less than 0.02. TSH was low at 0.015, however, the patient's free thyroxine was normal at 1.34. Urine drug screen was negative. The patient's CBC, white blood cell count was 10.3, hemoglobin was 11.8, platelet count was 203,000 with low MCV of 75. Coagulation panel was unremarkable. Urinalysis revealed yellow cloudy urine, negative for glucose, bilirubin, or ketones, specific gravity was 1.012, pH was 5.0, 1 + blood, 100 mg/dl protein, negative for nitrites, 3 + leukocytes esterase, 30 red blood cells, 178 white blood cells, 3 + bacteria, 5 epithelial cells, white blood cell clumps as well as mucus were present and 5 hyaline casts as well as calcium oxalate crystals. ABGs were performed on the assist control ventilator , pH was 7.56, pCO2 was 28, pO2 was 135, saturation was 97.7% on the ventilator at tidal volume of 450. Lactic acid level was elevated at 2.8. PEEP of 5,  FiO2 of 40%, and mechanical rate of 15. The patient was managed on mechanical ventilation and consultation with neurologist was obtained. Neurologist felt that the patient's Keppra should be advanced to 750 mg IV twice daily dose. The patient underwent electroencephalographic evaluation on 06/16/2014 which showed a possible indication of epilepsy and the second electroencephalogram done on 06/17/2014 revealed absence of left temporal sharp waves which were previously seen, but there was no further indication of epilepsy according to electroencephalographic results. Although since the patient was poorly responsive Vimpat was also added to control her seizure activity. She was continued on Keppra and Vimpat and she was much more responsive and following commands by 06/19/2014. She was tried to be extubated, however failed extubation and had to be reintubated again. In total she had 2 failures, 1 done on 06/16/2014 and the second one on 06/19/2014.  She failed to maintain her airways, initially she had stridor on 06/16/2014, she initiated on steroids. On 06/19/2014 she did not notice to have stridor, however she is to continue steroid taper. 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 She is to be continued on mechanical ventilation for now, she is to likely have a tracheostomy tube placement in the next few days after discharge as management of her acute respiratory failure.    In regards to malignant hypertension which was noted on arrival to the hospital, the patient was much better  controlled on current medications.    In regards to advanced dementia the patient is to continue her outpatient management.   The patient was noted to have diarrhea on 06/19/2014, which tested for Clostridium difficile  positive. She is being initiated on Flagyl per G-tube, she  is to continue Flagyl for 13 more days to complete a 2 week course.   In regards to recurrent UTIs, as mentioned above the patient's urinalysis was abnormal on arrival to the hospital, the patient's urine cultures showed 20,000 colony-forming units of Escherichia coli sensitive to nitrofurantoin, resistant to cefazolin, ampicillin, and ceftriaxone as well as ciprofloxacin, also resistant to levofloxacin and trimethoprim/sulfamethoxazole, however sensitive to gentamicin and imipenem, ertapenem as well as cefoxitin and ESBL positive. She also was noted to have Enterococcus faecium 100,000 colony-forming units resistant to penicillins, nitrofurantoin, ampicillin, as well as ciprofloxacin and levofloxacin and sensitive to vancomycin, however since the patient did show 2 cultures positive we felt it was very likely contamination. We initiated her on contact precautions due to ESBL Escherichia coli, but we did not treat her bacterial disease especially in view of Clostridium difficile colitis.   Regarding her chronic medical problems such as hypertension, diabetes, hyperthyroidism, the patient is to continue her outpatient medications. As mentioned above TSH was checked, free T4 was found to be normal.   On the day of discharge, 06/20/2014, the patient's vital signs, temperature was 98.1, pulse was ranging from 75-106, respiration rate was 15-19, blood pressure 152/83-177/86, O2 saturations were 100% on vent.   TIME SPENT: 40 minutes on this patient.    ____________________________ Katharina Caper, MD rv:bu Guerra: 06/20/2014 12:06:03 ET T: 06/20/2014 12:58:18 ET JOB#: [id number]  cc: Katharina Caper, MD, <Dictator> Mykaela Arena MD ELECTRONICALLY SIGNED 07/01/2014 14:20

## 2015-01-06 NOTE — Consult Note (Signed)
Brief Consult Note: Comments: Spoke with patient's RN.  GI consult for PEG cancelled.  Pt will be discharged to [facility] tomorrow & PEG will be placed there.  Electronic Signatures: Joselyn ArrowJones, Janye Maynor L (NP)  (Signed 05-Oct-15 19:08)  Authored: Brief Consult Note   Last Updated: 05-Oct-15 19:08 by Joselyn ArrowJones, Carigan Lister L (NP)

## 2015-01-06 NOTE — Consult Note (Signed)
Brief Consult Note: Diagnosis: Pna, bactermia, UTI, sacral decub.   Patient was seen by consultant.   Recommend further assessment or treatment.   Orders entered.   Comments: All of the organisms are sensitive to cefazolin except for the esbl E coli at 50 K CFU in UCX Would change to cefazolin for an 8 day total abx course Macrobid for the ESBL E coli for 10 days.  Electronic Signatures: Dierdre HarnessFitzgerald, Leotha Westermeyer Patrick (MD)  (Signed 22-Dec-15 22:13)  Authored: Brief Consult Note   Last Updated: 22-Dec-15 22:13 by Dierdre HarnessFitzgerald, Trinetta Alemu Patrick (MD)

## 2015-01-06 NOTE — Consult Note (Signed)
   Comments   Spoke with Eugenie BirksHCPOA, Markus.  He confirms Full Code for pt.  Stephanie AcreMarkus states he would like for pt to go to [facility] upon discharge.  He states he understands pt conditions and recurrent UTI's and would like it treated and dc'd to [facility] when appropriate.  Palliative Medicine will follow pt as needed during this hospitalization.  Electronic Signatures: Maxine Fredman, Jaquelyn BitterStephen J (NP)  (Signed [phone]-Nov-15 10:51)  Authored: Palliative Care   Last Updated: 16-Nov-15 10:51 by Reather LaurenceMantzouris, Calianna Kim J (NP)

## 2015-01-06 NOTE — Consult Note (Signed)
Reason for Consult: Chief Complaint: seizure activity   History of Present Illness: History of Present Illness:   74 year old female who was discharged on the 21st of September with urinary tract infection and hypernatremia who presents from her nursing home with continous seizure activity.  Apparently, the patient had a seizure for 20-30 min. EMS was called. She received 4 Versed, still had status epilepticus when she arrived to the ER. Pt was intubated for airway protection.  Elevated BP on admission. titrating off propfol.   no acute intracranial abnormality. CT  c spine degenerative disk dz.   ROS:  Review of Systems   Uanble to access.   Past Medical/Surgical Hx:  symbolic dysfunction:   hyperlipidemia:   htn:   dysphagia:   Dementia: per chart notes  pt. poor historian, unable to get medical history:   Diabetes Mellitus, Type II (NIDD):   rapid onset of short term memory loss:   cva:   anemia:   Home Medications: Medication Instructions Last Modified Date/Time  tamsulosin 0.4 mg oral capsule 1 cap(s) orally once a day (after a meal) 29-Sep-15 13:56  ziprasidone 20 mg oral capsule 1 cap(s) orally 1 to 2 times a day, As Needed, agitation , As needed, agitation 29-Sep-15 13:56  lovastatin 40 mg oral tablet 1 tab(s) orally once a day 29-Sep-15 13:56  Aricept 10 mg oral tablet 1 tab(s) orally once a day (at bedtime) 29-Sep-15 13:56  Tradjenta 5 mg oral tablet 1 tab(s) orally once a day (at bedtime) 29-Sep-15 13:56  metFORMIN extended release 750 mg oral tablet, extended release 1 tab(s) orally 2 times a day 29-Sep-15 13:56  NovoLIN R human recombinant 100 units/mL injectable solution 1 dose(s) subcutaneous 4 times a day (before meals and at bedtime) per sliding scale: 1-150= 0 units 151-200= 2 unit 201-250= 4 units 251-300= 6 units 301-350= 8 units 351-400= 10 units >400= call MD  29-Sep-15 13:56  Lopressor 50 mg oral tablet 1 tab(s) orally 2 times a day 29-Sep-15 13:56   Vitamin B-12 250 mcg oral tablet 1 tab(s) orally once a day 29-Sep-15 13:56  Ferrousal 50 milligram(s) orally once a day 29-Sep-15 13:56  Calcium 600+D 600 mg-200 units oral tablet 1 tab(s) orally once a day 29-Sep-15 13:56  Vitamin D3 1000 intl units oral tablet 1 tab(s) orally once a day 29-Sep-15 13:56  Durezol 0.05% ophthalmic emulsion 1 drop(s) to left eye 2 times a day 29-Sep-15 13:56  Ilevro 0.3% ophthalmic suspension 1 drop(s) to left eye once a day 29-Sep-15 13:56  methimazole 10 mg oral tablet 1 tab(s) orally once a day 29-Sep-15 13:56  losartan 100 mg oral tablet 1 tab(s) orally once a day 29-Sep-15 13:56  isosorbide mononitrate 60 mg oral tablet, extended release 1 tab(s) orally once a day 29-Sep-15 13:56  clopidogrel 75 mg oral tablet 1 tab(s) orally once a day 29-Sep-15 13:56   Allergies:  Verapamil: Rash  Lotensin: Unknown  Ativan: Unknown  Haldol: Unknown  Vital Signs: **Vital Signs.:   30-Sep-15 00:00  Vital Signs Type Routine  Pulse Pulse 88  Respirations Respirations 15  Systolic BP Systolic BP 91  Diastolic BP (mmHg) Diastolic BP (mmHg) 60  Mean BP 70  BP Source  if not from Vital Sign Device non-invasive  Pulse Ox % Pulse Ox % 100  Pulse Ox Activity Level  At rest  Oxygen Delivery Ventilator Assisted    00:30  Vital Signs Type Routine  Pulse Pulse 86  Respirations Respirations 15  Systolic BP Systolic BP  68  Diastolic BP (mmHg) Diastolic BP (mmHg) 52  Mean BP 57  BP Source  if not from Vital Sign Device non-invasive  Pulse Ox % Pulse Ox % 100  Pulse Ox Activity Level  At rest  Oxygen Delivery Ventilator Assisted    00:35  Vital Signs Type Recheck  Pulse Pulse 85  Respirations Respirations 15  Systolic BP Systolic BP 75  Diastolic BP (mmHg) Diastolic BP (mmHg) 52  Mean BP 59  BP Source  if not from Vital Sign Device non-invasive  Pulse Ox % Pulse Ox % 100  Pulse Ox Activity Level  At rest  Oxygen Delivery Ventilator Assisted    00:45  Vital  Signs Type Recheck  Pulse Pulse 84  Respirations Respirations 15  Systolic BP Systolic BP 78  Diastolic BP (mmHg) Diastolic BP (mmHg) 56  Mean BP 63  BP Source  if not from Vital Sign Device non-invasive  Pulse Ox % Pulse Ox % 100  Pulse Ox Activity Level  At rest  Oxygen Delivery Ventilator Assisted    00:55  Vital Signs Type Recheck  Pulse Pulse 81  Respirations Respirations 15  Systolic BP Systolic BP 332  Diastolic BP (mmHg) Diastolic BP (mmHg) 66  Mean BP 78  BP Source  if not from Vital Sign Device non-invasive  Pulse Ox % Pulse Ox % 100  Pulse Ox Activity Level  At rest  Oxygen Delivery Ventilator Assisted    01:00  Vital Signs Type Routine  Pulse Pulse 82  Respirations Respirations 16  Systolic BP Systolic BP 951  Diastolic BP (mmHg) Diastolic BP (mmHg) 71  Mean BP 88  BP Source  if not from Vital Sign Device non-invasive  Pulse Ox % Pulse Ox % 100  Pulse Ox Activity Level  At rest  Oxygen Delivery Ventilator Assisted    01:30  Vital Signs Type Routine  Pulse Pulse 82  Respirations Respirations 17  Systolic BP Systolic BP 884  Diastolic BP (mmHg) Diastolic BP (mmHg) 68  Mean BP 87  BP Source  if not from Vital Sign Device non-invasive  Pulse Ox % Pulse Ox % 100  Pulse Ox Activity Level  At rest  Oxygen Delivery Ventilator Assisted    02:00  Vital Signs Type Routine  Pulse Pulse 80  Respirations Respirations 15  Systolic BP Systolic BP 96  Diastolic BP (mmHg) Diastolic BP (mmHg) 62  Mean BP 73  BP Source  if not from Vital Sign Device non-invasive  Pulse Ox % Pulse Ox % 100  Pulse Ox Activity Level  At rest  Oxygen Delivery Ventilator Assisted    02:15  Vital Signs Type Routine  Temperature Temperature (F) 98.4  Celsius 36.8  Temperature Source oral    02:30  Vital Signs Type Routine  Pulse Pulse 80  Respirations Respirations 15  Systolic BP Systolic BP 98  Diastolic BP (mmHg) Diastolic BP (mmHg) 61  Mean BP 73  BP Source  if not from Vital  Sign Device non-invasive  Pulse Ox % Pulse Ox % 100  Pulse Ox Activity Level  At rest  Oxygen Delivery Ventilator Assisted    03:00  Vital Signs Type Routine  Pulse Pulse 79  Respirations Respirations 15  Systolic BP Systolic BP 166  Diastolic BP (mmHg) Diastolic BP (mmHg) 64  Mean BP 78  BP Source  if not from Vital Sign Device non-invasive  Pulse Ox % Pulse Ox % 99  Pulse Ox Activity Level  At rest  Oxygen Delivery Ventilator Assisted    04:00  Vital Signs Type Routine  Pulse Pulse 78  Respirations Respirations 15  Systolic BP Systolic BP 389  Diastolic BP (mmHg) Diastolic BP (mmHg) 70  Mean BP 85  BP Source  if not from Vital Sign Device non-invasive  Pulse Ox % Pulse Ox % 100  Pulse Ox Activity Level  At rest  Oxygen Delivery Ventilator Assisted    04:24  Vital Signs Type Routine  Temperature Temperature (F) 98.2  Celsius 36.7  Temperature Source oral    05:00  Vital Signs Type Routine  Pulse Pulse 78  Respirations Respirations 15  Systolic BP Systolic BP 88  Diastolic BP (mmHg) Diastolic BP (mmHg) 57  Mean BP 67  BP Source  if not from Vital Sign Device non-invasive  Pulse Ox % Pulse Ox % 100  Pulse Ox Activity Level  At rest  Oxygen Delivery Ventilator Assisted    05:30  Vital Signs Type Routine  Pulse Pulse 75  Respirations Respirations 15  Systolic BP Systolic BP 373  Diastolic BP (mmHg) Diastolic BP (mmHg) 64  Mean BP 79  BP Source  if not from Vital Sign Device non-invasive  Pulse Ox % Pulse Ox % 100  Pulse Ox Activity Level  At rest  Oxygen Delivery Ventilator Assisted    06:00  Vital Signs Type Routine  Temperature Temperature (F) 98.5  Celsius 36.9  Temperature Source oral  Pulse Pulse 76  Respirations Respirations 15  Systolic BP Systolic BP 428  Diastolic BP (mmHg) Diastolic BP (mmHg) 62  Mean BP 75  BP Source  if not from Vital Sign Device non-invasive  Pulse Ox % Pulse Ox % 100  Pulse Ox Activity Level  At rest  Oxygen Delivery  Ventilator Assisted    07:30  Pulse Pulse 75  Respirations Respirations 15  Systolic BP Systolic BP 768  Diastolic BP (mmHg) Diastolic BP (mmHg) 64  Mean BP 80  Pulse Ox % Pulse Ox % 100  Pulse Ox Activity Level  At rest  Oxygen Delivery Ventilator Assisted    08:00  Temperature Temperature (F) 98.2  Celsius 36.7  Pulse Pulse 75  Respirations Respirations 15  Systolic BP Systolic BP 115  Diastolic BP (mmHg) Diastolic BP (mmHg) 61  Mean BP 74  Pulse Ox % Pulse Ox % 100  Pulse Ox Activity Level  At rest  Oxygen Delivery Ventilator Assisted    09:00  Pulse Pulse 75  Respirations Respirations 15  Systolic BP Systolic BP 726  Diastolic BP (mmHg) Diastolic BP (mmHg) 65  Mean BP 78  Pulse Ox % Pulse Ox % 100  Pulse Ox Activity Level  At rest  Oxygen Delivery Ventilator Assisted    10:00  Temperature Temperature (F) 98.2  Celsius 36.7  Temperature Source oral  Pulse Pulse 76  Respirations Respirations 15  Systolic BP Systolic BP 203  Diastolic BP (mmHg) Diastolic BP (mmHg) 81  Mean BP 105  Pulse Ox % Pulse Ox % 100  Pulse Ox Activity Level  At rest  Oxygen Delivery Ventilator Assisted    11:00  Temperature Temperature (F) 98.2  Celsius 36.7  Temperature Source oral  Pulse Pulse 79  Respirations Respirations 15  Systolic BP Systolic BP 559  Diastolic BP (mmHg) Diastolic BP (mmHg) 84  Mean BP 110  Pulse Ox % Pulse Ox % 100  Pulse Ox Activity Level  At rest  Oxygen Delivery Ventilator Assisted    12:00  Pulse Pulse  80  Respirations Respirations 15  Systolic BP Systolic BP 017  Diastolic BP (mmHg) Diastolic BP (mmHg) 81  Mean BP 112  Pulse Ox % Pulse Ox % 100  Pulse Ox Activity Level  At rest  Oxygen Delivery Ventilator Assisted   EXAM: Pt is intubated, does not follow commands, Dysconjugate gaze preference.  does not repsond to visual threats.  Lab Results: Thyroid:  29-Sep-15 12:08   Thyroid Stimulating Hormone  0.015 (0.45-4.50 (IU = International  Unit)  ----------------------- Pregnant patients have  different reference  ranges for TSH:  - - - - - - - - - -  Pregnant, first trimetser:  0.36 - 2.50 uIU/mL)  Hepatic:  29-Sep-15 12:08   Bilirubin, Total 0.2  Alkaline Phosphatase 109 (46-116 NOTE: New Reference Range 04/04/14)  SGPT (ALT) 45 (14-63 NOTE: New Reference Range 04/04/14)  SGOT (AST) 35  Total Protein, Serum 6.9  Albumin, Serum  3.0  Routine Micro:  29-Sep-15 19:47   Micro Text Report URINE CULTURE   COMMENT                   COLONIES TOO SMALL TO READ   ANTIBIOTIC                       Specimen Source CLEAN CATCH  Culture Comment COLONIES TOO SMALL TO READ  Result(s) reported on 14 Jun 2014 at 10:55AM.  Lab:  29-Sep-15 13:20   pH (ABG)  7.560 (7.350-7.450 NOTE: New Reference Range 04/08/14)  PCO2  28 (32-48 NOTE: New Reference Range 04/25/14)  PO2  135 (83-108 NOTE: New Reference Range 04/08/14)  Base Excess  4 (-3-3 NOTE: New Reference Range 04/25/14)  HCO3 25.1 (21.0-28.0 NOTE: New Reference Range 04/08/14)  O2 Saturation 97.7  O2 Device vent  Specimen Site (ABG) LT RADIAL  Specimen Type (ABG) ARTERIAL  Patient Temp (ABG) 37.0  Mode ASSIST CONTROL  Vt 450  PEEP 5.0  Mechanical Rate 15 (Result(s) reported on 13 Jun 2014 at 01:52PM.)  %FiO2 40.0  Lactic Acid, Arterial, Cardiopulmonary  2.8 (0.3-0.8 NOTE: New Reference Range 04/25/14)  30-Sep-15 04:10   pH (ABG) 7.45 (7.350-7.450 NOTE: New Reference Range 04/08/14)  PCO2 35 (32-48 NOTE: New Reference Range 04/25/14)  PO2 98 (83-108 NOTE: New Reference Range 04/08/14)  FiO2 24  Base Excess 0.7 (-3-3 NOTE: New Reference Range 04/25/14)  HCO3 24.3 (21.0-28.0 NOTE: New Reference Range 04/08/14)  O2 Saturation 97.5  O2 Device mech vent  Specimen Site (ABG) LT RADIAL  Specimen Type (ABG) ARTERIAL  Patient Temp (ABG) 37.0  Mode ASSIST CONTROL  Vt 400  PEEP 5.0  Mechanical Rate 15 (Result(s) reported on 14 Jun 2014 at 04:26AM.)   Routine Chem:  29-Sep-15 12:08   Glucose, Serum  176  BUN 12  Creatinine (comp) 1.28  Sodium, Serum 140  Potassium, Serum 4.5  Chloride, Serum 104  CO2, Serum 24  Calcium (Total), Serum 8.6  Anion Gap 12  Osmolality (calc) 283  eGFR (African American)  52  eGFR (Non-African American)  43 (eGFR values <38m/min/1.73 m2 may be an indication of chronic kidney disease (CKD). Calculated eGFR, using the MRDR Study equation, is useful in  patients with stable renal function. The eGFR calculation will not be reliable in acutely ill patients when serum creatinine is changing rapidly. It is not useful in patients on dialysis. The eGFR calculation may not be applicable to patients at the low and high extremes of body sizes, pregnant women,  and vetetarians.)  Result Comment Potassium/AST - Slight hemolysis, interpret results with  - caution.  Result(s) reported on 13 Jun 2014 at 12:45PM.  Ethanol, S. < 3 (Result(s) reported on 13 Jun 2014 at 12:45PM.)  30-Sep-15 09:40   Glucose, Serum  126  BUN 11  Creatinine (comp) 1.04  Sodium, Serum 143  Potassium, Serum  3.3  Chloride, Serum  110  CO2, Serum 26  Calcium (Total), Serum  8.2  Anion Gap 7  Osmolality (calc) 286  eGFR (African American) >60  eGFR (Non-African American)  55 (eGFR values <53m/min/1.73 m2 may be an indication of chronic kidney disease (CKD). Calculated eGFR, using the MRDR Study equation, is useful in  patients with stable renal function. The eGFR calculation will not be reliable in acutely ill patients when serum creatinine is changing rapidly. It is not useful in patients on dialysis. The eGFR calculation may not be applicable to patients at the low and high extremes of body sizes, pregnant women, and vetetarians.)  Urine Drugs:  [id number]  Tricyclic Antidepressant, Ur Qual (comp) NEGATIVE (Result(s) reported on 13 Jun 2014 at 12:39PM.)  Amphetamines, Urine Qual. NEGATIVE  MDMA, Urine Qual. NEGATIVE   Cocaine Metabolite, Urine Qual. NEGATIVE  Opiate, Urine qual NEGATIVE  Phencyclidine, Urine Qual. NEGATIVE  Cannabinoid, Urine Qual. NEGATIVE  Barbiturates, Urine Qual. NEGATIVE  Benzodiazepine, Urine Qual. NEGATIVE (----------------- The URINE DRUG SCREEN provides only a preliminary, unconfirmed analytical test result and should not be used for non-medical  purposes.  Clinical consideration and professional judgment should be  applied to any positive drug screen result due to possible interfering substances.  A more specific alternate chemical method must be used in order to obtain a confirmed analytical result.  Gas chromatography/mass spectrometry (GC/MS) is the preferred confirmatory method.)  Methadone, Urine Qual. NEGATIVE  Cardiac:  29-Sep-15 12:08   Troponin I < 0.02 (0.00-0.05 0.05 ng/mL or less: NEGATIVE  Repeat testing in 3-6 hrs  if clinically indicated. >0.05 ng/mL: POTENTIAL  MYOCARDIAL INJURY. Repeat  testing in 3-6 hrs if  clinically indicated. NOTE: An increase or decrease  of 30% or more on serial  testing suggests a  clinically important change)  Routine UA:  29-Sep-15 12:08   Color (UA) Yellow  Clarity (UA) Cloudy  Glucose (UA) Negative  Bilirubin (UA) Negative  Ketones (UA) Negative  Specific Gravity (UA) 1.012  Blood (UA) 1+  pH (UA) 5.0  Protein (UA) 100 mg/dL  Nitrite (UA) Negative  Leukocyte Esterase (UA) 3+ (Result(s) reported on 13 Jun 2014 at 12:45PM.)  RBC (UA) 30 /HPF  WBC (UA) 178 /HPF  Bacteria (UA) 3+  Epithelial Cells (UA) 5 /HPF  WBC Clump (UA) PRESENT  Mucous (UA) PRESENT  Hyaline Cast (UA) 5 /LPF  Calcium Oxalate Crystal (UA) PRESENT (Result(s) reported on 13 Jun 2014 at 12:45PM.)  Routine Coag:  29-Sep-15 12:08   Prothrombin 13.1  INR 1.0 (INR reference interval applies to patients on anticoagulant therapy. A single INR therapeutic range for coumarins is not optimal for all indications; however, the suggested range for  most indications is 2.0 - 3.0. Exceptions to the INR Reference Range may include: Prosthetic heart valves, acute myocardial infarction, prevention of myocardial infarction, and combinations of aspirin and anticoagulant. The need for a higher or lower target INR must be assessed individually. Reference: The Pharmacology and Management of the Vitamin K  antagonists: the seventh ACCP Conference on Antithrombotic and Thrombolytic Therapy. [id number].6237Sept:126 (3suppl): [id number] A HCT value >55% may artifactually  increase the PT.  In one study,  the increase was an average of 25%. Reference:  "Effect on Routine and Special Coagulation Testing Values of Citrate Anticoagulant Adjustment in Patients with High HCT Values." American Journal of Clinical Pathology 2006;126:400-405.)  Routine Hem:  29-Sep-15 12:08   WBC (CBC) 10.3  RBC (CBC) 4.87  Hemoglobin (CBC)  11.8  Hematocrit (CBC) 36.7  Platelet Count (CBC) 303 (Result(s) reported on 13 Jun 2014 at 12:30PM.)  MCV  75  MCH  24.3  MCHC 32.3  RDW  15.7  30-Sep-15 09:40   WBC (CBC) 10.3  RBC (CBC) 4.53  Hemoglobin (CBC)  10.7  Hematocrit (CBC)  34.2  Platelet Count (CBC) 249  MCV  76  MCH  23.7  MCHC  31.4  RDW  15.8  Neutrophil % 50.6  Lymphocyte % 29.0  Monocyte % 14.9  Eosinophil % 4.6  Basophil % 0.9  Neutrophil # 5.2  Lymphocyte # 3.0  Monocyte #  1.5  Eosinophil # 0.5  Basophil # 0.1 (Result(s) reported on 14 Jun 2014 at 09:55AM.)   Radiology Results: CT:    29-Sep-15 13:00, CT Head Without Contrast  CT Head Without Contrast   REASON FOR EXAM:    ams, status epilepticus  COMMENTS:   May transport without cardiac monitor    PROCEDURE: CT  - CT HEAD WITHOUT CONTRAST  - Jun 13 2014  1:00PM     CLINICAL DATA:  74 year old female status post seizure and fall.  Initial encounter.    EXAM:  CT HEAD WITHOUT CONTRAST    CT CERVICAL SPINE WITHOUT CONTRAST    TECHNIQUE:  Multidetector CT imaging of the head and  cervical spine was  performed following the standard protocol without intravenous  contrast. Multiplanar CT image reconstructions of the cervical spine  were also generated.    COMPARISON:  Head CT without contrast 06/01/2014. Brain MRI  04/26/2014.    FINDINGS:  CT HEAD FINDINGS    Intubated, oral enteric tube in place. Paranasal sinuses and  mastoids are stable and largely clear.    There is a mild scalp hematoma at the right vertex. Hyperostosis of  the calvarium. No skull fracture identified. Stable orbits.  Extensive Calcified atherosclerosis at the skull base. Stable  cerebral volume, chronic ventriculomegalyand confluent cerebral  white matter hypodensity. No midline shift, mass effect, or evidence  of intracranial mass lesion. No acute intracranial hemorrhage  identified. No suspicious intracranial vascular hyperdensity. No  evidence of cortically based acute infarction identified.    CT CERVICAL SPINE FINDINGS    Intubated with fluid in the pharynx. Negative non contrast  paraspinal soft tissues except for multinodular thyroid goiter.  Negative lung apices.    Visualized skull base is intact. No atlanto-occipital dissociation.  Cervicothoracic junction alignment is within normal limits.  Bilateral posterior element alignment is within normal limits.  Widespread disc and ligament flavum degeneration, up partial  calcification. Multilevel degenerative cervical spinal stenosis most  pronounced at C3-C4, moderate. No acute cervical spine fracture  identified.     IMPRESSION:  1. Small scalp hematoma at the vertex.  No calvarium fracture.  2.  No acute intracranial abnormality.  3. No acute fracture or listhesis identified in the cervical spine.  Ligamentous injury is not excluded.  4. Degenerative multilevel cervical spinal stenosis most pronounced  at C3-C4 (moderate).  5. Intubated and oral enteric tube in place, visible tube placement  is  satisfactory.  Electronically Signed    By: Truman Hayward  [place] Crane M.D.    On: 06/13/2014 13:20         Verified By: Gwenyth Bender. HALL, M.D.,   Impression/Recommendations: Recommendations:   74 year old female who was discharged on the 21st of September with urinary tract infection and hypernatremia who presents from her nursing home with continous seizure activity.  Apparently, the patient had a seizure for 20-30 min. EMS was called. She received 4 Versed, still had status epilepticus when she arrived to the ER. Pt was intubated for airway protection.  Elevated BP on admissionhx of seizure but possibly due to HTN urgency.   Con't KeppraTitrate off propofolDon't think need to wait for EEG, if pt is able to wake up for possible extubationDepending on pt's condition after extubation, will decide on need for MRI  Electronic Signatures: Leotis Pain (MD)  (Signed 30-Sep-15 12:59)  Authored: Consult, History of Present Illness, Review of Systems, PAST MEDICAL/SURGICAL HISTORY, HOME MEDICATIONS, ALLERGIES, NURSING VITAL SIGNS, Physical Exam-, LAB RESULTS, RADIOLOGY RESULTS, Recommendations   Last Updated: 30-Sep-15 12:59 by Leotis Pain (MD)

## 2015-01-06 NOTE — H&P (Signed)
PATIENT NAME:  Michele Guerra, Michele Guerra MR#:  [id number] DATE OF BIRTH:  1940-12-24  DATE OF ADMISSION:  06/01/2014  PRIMARY CARE PROVIDER: Hillery Aldo, M.D.  CHIEF COMPLAINT: Confusion.  HISTORY OF PRESENT ILLNESS: A 74 year old female patient with history of dementia recurrent urinary tract infection, aspiration pneumonia, hypertension, hyperlipidemia, diabetes, presents to the Emergency Room from skilled nursing facility with complaints of confusion. The patient has not been eating or drinking much, has been confused. She was brought to the Emergency Room. Here she had to be given a dose of Geodon secondary to her agitation. Has been found to have sodium greater than 160 and also urinary retention with a Foley catheter training 1000 mg of urine. She also has acute renal failure and is being admitted to the hospitalist service.   The patient was last seen in the hospital in August 2015 with aspiration pneumonia, gram-negative rod urinary tract infection, with encephalopathy and discharged to skilled nursing facility. The patient continues to be a full code although she seems like somebody who could benefit from being on comfort measures.   PAST MEDICAL HISTORY:  1. Severe dementia.  2. Dysphagia.  3. Hypertension.  4. Diabetes.  5. Recurrent urinary tract infection.  6. Hypothyroidism.   SOCIAL HISTORY: The patient is from skilled nursing facility. No smoking no alcohol. No illicit drug use.   CODE STATUS: Full code.   ALLERGIES: ATIVAN, HALDOL, LOTENSIN, VERAPAMIL.   REVIEW OF SYSTEMS: Unobtainable secondary to the patient's confusion.   HOME MEDICATIONS:  1. Aricept 10 mg oral once a day.  2. Calcium vitamin D 1 tablet daily. 3. Clopidogrel 75 mg daily.  4. FeroSul 50 mg daily.  5. Hydralazine 50 mg oral 2 times a day.  6. Isosorbide mononitrate 60 mg oral once a day.  7. Lopressor 50 mg oral 2 times a day.  8. Losartan 100 mg daily.  9. Lovastatin 40 mg daily.  10. Methimazole 10 mg  daily.  11. Tradjenta 5 mg oral once a day.  12. Vitamin B12 250 mcg daily.  13. Vitamin D and C 1000 international units oral once a day.   PHYSICAL EXAMINATION:  VITAL SIGNS: Temperature 98.9, pulse 75, blood pressure 103/71, saturating 95% on room air, presently pulse is 115.  GENERAL: Elderly African American female patient lying in bed, confused, drowsy, restless moving all her arms and legs.  HEENT: Atraumatic, normocephalic. Oral mucosa dry and pink. Pallor positive. No icterus. Pupils equal and reactive to light.  NECK: Supple. No thyromegaly or palpable lymph nodes. Trachea midline. No JVD.  CARDIOVASCULAR: S1, S2, tachycardic without any murmurs.  RESPIRATORY: Normal work of breathing.  Clear to auscultation.   GASTROINTESTINAL: Soft abdomen, nontender. Bowel sounds present.  GENITOURINARY: No suprapubic tenderness, has a Foley catheter in place with mildly pink-tinged urine.  MUSCULOSKELETAL: No joint tenderness large joints. Normal muscle tone.  NEUROLOGIC: Moving all four extremities. Neurological examination could not be tested secondary to encephalopathy.   LABORATORY STUDIES: Show glucose of 215, BUN 44, creatinine 2.31 with sodium greater than 160, chloride greater than 128 with potassium 3.5. AST, ALT, alkaline phosphatase normal. Troponin less than 0.02   CBC shows WBC of 14.1 with platelets of 289,000 and hemoglobin 12.1. Has 1% bands.   EKG shows sinus tachycardia, nothing acute.   CT scan of the head without contrast shows no acute intracranial process. Small amount of fluid in the frontal sinus.   Chest x-ray, portable, shows nothing acute.   ASSESSMENT AND PLAN:  1. Severe hypernatremia with acute encephalopathy in a patient with advanced dementia. This is likely secondary to the patient's decreased intake from the dementia. We will bolus at this point 2 liters of half normal saline and then put her on D5 to slowly trend her sodium down and will monitor closely.  We will put the patient into stepdown secondary to critical nature of severe hypernatremia, high risk for seizures for further duration. We will use p.r.n. Ativan for her encephalopathy if she gets agitated. The patient will be n.p.o. on bedrest.  2. Diabetes mellitus. The patient's blood sugars are expected on high being on D5, put her on sliding scale insulin.  3. Hypertension. Hold medications.  4. Acute renal failure secondary to severe dehydration. We will monitor closely with Is and Os.   5. Leukocytosis. The patient had a recent gram-negative rod urinary tract infection along with aspiration pneumonia, although she does not have any bacteria in the urine. Does have increased WBCs. We will put her on ceftriaxone get blood and urine cultures.  6. Deep vein thrombosis prophylaxis with heparin.   CODE STATUS: Full code.   TIME SPENT AND CRITICAL CARE TIME ON THIS PATIENT: 40 minutes.    ____________________________ Molinda BailiffSrikar R. Julionna Marczak, MD srs:JT D: 06/01/2014 22:12:40 ET T: 06/01/2014 22:36:25 ET JOB#: [id number]  cc: Wardell HeathSrikar R. Elpidio AnisSudini, MD, <Dictator> Sarah "Sallie" Allena KatzPatel, MD Orie FishermanSRIKAR R Dave Mannes MD ELECTRONICALLY SIGNED 06/07/2014 16:23

## 2015-01-06 NOTE — H&P (Signed)
PATIENT NAME:  Michele Guerra, Michele Guerra MR#:  [id number] DATE OF BIRTH:  [birth year]  DATE OF ADMISSION:  04/21/2014  REFERRING EMERGENCY DEPARTMENT PHYSICIAN:  Dr. Jodi MourningZavitz.  PRIMARY CARE PHYSICIAN: At [facility], Dr. Yehuda MaoWillard.   CHIEF COMPLAINT: Agitation.   HISTORY OF PRESENT ILLNESS: This 74 year old woman with history of dementia, hypertension, and diabetes presents today from [facility] do to increasing agitation. In the Emergency Room, she is found to have a urinary tract infection. She is unable to participate in the interview due to advanced dementia. She is up and moving around the room, seems very uncomfortable, frequently asks to use the bathroom.   PAST MEDICAL HISTORY: 1.  Severe dementia.  2.  Hypertension.  3.  Hyperlipidemia.  4.  Diabetes mellitus, type 2, insulin-dependent   PAST SURGICAL HISTORY:  1.  Of note, the patient has had a cataract repair on August 3, in the left eye. 2.  She has a scar from lumbar spinal surgery.   SOCIAL HISTORY: The patient is a resident at [facility]. She does not use alcohol, tobacco, or any illicit substances. She is accompanied by her second cousin today in the Emergency Room. Her power of attorney is her son, who is currently in [place].   FAMILY HISTORY: There is no history of CVA in the family   HOME MEDICATIONS:  1.  Lovastatin 40 mg daily.  2.  NovoLog per sliding scale.  3.  Aricept 5 mg 1 tablet daily.  4.  Tradjenta tablet 5 mg 1 tablet 1 time a day.  5.  Lopressor 50 mg 1 tablet twice a day.  6.  Metformin extended release 750 mg 1 tablet 2 times a day.  7.  Vitamin B12 give 250 mg 1 time a day as a nutritional supplement.  8.  FerrouSul tablet 50 mg one time a day as a nutritional supplement.  9.  Clopidogrel bisulfate 75 mg 1 tablet once a day.  10.  Vitamin E- calcium 600/200 mg/units, give 1 tablet once a day as a nutritional supplement.  11.  Vitamin D3 1000 units once a day as a nutritional  supplement.  12.  Durezol emulsion 0.05%.  Directions 1 drop into the left eye 2 times a day for post-cataract surgery in the left eye.  13.  Vigamox solution 0.5%. Instill 1 drop in the left eye 4 times a day for post left eye surgery.  14.  Ilevro suspension 0.3%, one drop in the left eye 1 time a day for cataracts.  15.  Losartan 50 mg 1 tablet daily.  16.  Methimazole 10 mg 1 tablet once a day.   ALLERGIES: THE PATIENT IS ALLERGIC TO VERAPAMIL WHICH CAUSES A RASH, LOTENSIN, ATIVAN, AND HALDOL.   REVIEW OF SYSTEMS: The patient is unable to perform the review of systems due to advanced dementia.   PHYSICAL EXAMINATION:  VITAL SIGNS: Temperature 97.1, pulse 108, respirations 30, systolic blood pressure 220, diastolic blood pressure 110, pulse oximetry 99% on room air.  GENERAL: The patient is alert, she uncomfortable, moving around the room. She seems to be in some mild distress.  HEENT: Pupils are equal, round, and reactive, conjunctiva are clear, mucous membranes are moist, many teeth are missing, there is no sign of oral infection, no exudate, no oral lesion.  NECK: Supple, no lymphadenopathy, no thyromegaly or tenderness.  PULMONARY: Lungs are clear to auscultation with good air movement bilaterally.  CARDIOVASCULAR: Tachycardic, regular, no murmurs, rubs, or gallops, no  edema, no JVD, no carotid bruit.  ABDOMEN: Bowel sounds are normal, abdomen is soft, nontender, nondistended, no guarding, no rebound, no mass.  EXTREMITIES: She is able to move all extremities spontaneously. Strength seems to be intact though she did not participate in this part of the examination, she walks without difficulty.  NEUROLOGIC: The patient is oriented to place and to her cousin in the room, otherwise, fairly disoriented, agitated, unable to participate in the neurologic examination, cranial nerves are grossly intact.   LABORATORY DATA:  Labs sodium 138, potassium 3.0, chloride 100, bicarbonate 29, calcium  9.7, BUN 15, creatinine 1.13, alkaline phosphatase 148, ALT 18, AST 11, total protein 8.3, albumin 4.4, red blood cells 18.7, hemoglobin 13.4, platelets 390,000, MCV 78. Urinalysis showing 3+ leukocyte esterase with 120 white blood cells per high-powered field.   ASSESSMENT AND PLAN:  1.  Urinary tract infection: Urine showing 112 white blood cells per high-powered field. Started Rocephin. Ordered urine culture and will adjust antibiotics based on culture and sensitivity.   2.  Agitation: She is usually fairly calm at her place of residence and does not normally take any medications for sedation. We will order a sitter as this is the best way to reorient and redirect the patient.   SHE IS ALLERGIC TO ATIVAN AND HALDOL PER HER MEDICAL RECORD.   Could consider use of trazodone if needed at night.   3.  Hypertension: Currently blood pressure is very uncontrolled likely due to infection and agitation. Continue with home medications. Will write for p.r.n. labetalol.   4.  Leukocytosis:  Due to infection. We will treat urinary tract infection.   5.  Diabetes: We will check a hemoglobin A1c, will start sliding scale.   6.  Hypokalemia: Will replete and recheck in the morning.   7.  Hyperthyroidism: We will continue methimazole and check a TSH, especially in light of her current agitation.   8.  Recent left eye surgery: Continue eyedrops as directed by ophthalmology.   9.  CODE STATUS: PER THE PATIENT'S CHART, SHE IS A FULL CODE.   TIME SPENT DURING THIS ADMISSION: 35 minutes.   ____________________________ Ena Dawley. Clent Ridges, MD cpw:ts Guerra: 04/21/2014 18:04:04 ET T: 04/21/2014 19:12:17 ET JOB#: [id number]  cc: Santina Evans P. Clent Ridges, MD, <Dictator> Gale Journey MD ELECTRONICALLY SIGNED 04/29/2014 13:57

## 2015-01-06 NOTE — H&P (Signed)
PATIENT NAME:  Michele Guerra, Michele Guerra#:  [id number] DATE OF BIRTH:  July 20, 1941  DATE OF ADMISSION:  08/06/2014  REFERRING PHYSICIAN: Lurena Joinerebecca L. Shaune PollackLord, M.D.   PRIMARY CARE DOCTOR: Teena Iraniavid M. Terance HartBronstein, M.D.  ADMISSION DIAGNOSIS: Sepsis due to indwelling urinary catheter infection.   HISTORY OF PRESENT ILLNESS: This is a 74 year old, African American female, who presents to the Emergency Department via EMS from her nursing home where she reportedly had fever and rigors. 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 She was just discharged from this hospital last week with a urinary tract infection and today presents with evidence of the same. Her history is significant for extended spectrum beta lactamase resistant Escherichia coli infection. Due to the patient's invalid status, yet current Full Code advanced directives, the Emergency Department called for admission.   REVIEW OF SYSTEMS: The patient cannot contribute to her own review of systems as she is nonverbal.   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   PAST SURGICAL HISTORY: Not available at this time.   FAMILY HISTORY: Hypertension and diabetes.   SOCIAL HISTORY: The patient has never been a smoker. She currently lives at [facility].   MEDICATIONS:  1. Lovenox 40 mg 1 injection daily.  2. Multivitamin 1 tablet per tube daily.  3. Levemir 35 units subcutaneously b.i.d.  4. Cholecalciferol 50,000 international units weekly on Wednesdays.  5. Ascorbic acid 500 mg 1 tablet per tube b.i.d.  6. Coreg 12.5 mg 1 tablet per tube b.i.d.  7. Flomax 0.4 mg 1 tablet per tube daily.  8. Lipitor 10 mg 1 tablet per tube at bedtime.  9. Plavix 75 mg 1 tablet per tube daily.  10. Methimazole 5 mg 3 tablets per  tube every 8 hours.  11. Keppra 750 mg per tube b.i.d.  12. Hydralazine 25 mg 1 tablet per tube t.i.d.  13. Pepcid 20 mg 1 tablet per tube b.i.d.  14. Ferrous sulfate 5 mL per tube daily.  15. Peridex 15 mL orally b.i.d.  16. Magnesium oxide 400 mg 1 tablet per tube b.i.d.  17. Invanz 1 gram IV q.24 h. for the next 7 days from the date of this dictation.  18. Glucerna 1.5 G-tube formula at 44 mL/hour with free water flushes every hour of 40 mL.   ALLERGIES: ATIVAN, HALDOL, LOTENSIN, VERAPAMIL.   PERTINENT LABORATORY RESULTS AND RADIOGRAPHIC FINDINGS: Serum glucose is 217, BUN is 53, creatinine 0.95, sodium is 155, potassium is 4.2, chloride is 177, bicarbonate is 31, calcium is 10, magnesium is 2.2, lipase is 110, serum albumin is 1.8, total bilirubin 0.1, alkaline phosphatase is 129, AST is 222, ALT is 499.   Troponin is negative.   Thyroid stimulating hormone is 0.63.   White blood cell count is 23.3, hemoglobin is 9.1, hematocrit is 29.1, platelet count is 490,000. MCV is 72.   Chest x-ray shows no active disease.   PHYSICAL EXAMINATION:  VITAL SIGNS: Temperature is 100.8, pulse is 99, respirations are 42, blood pressure is 137/73, pulse oximetry is 86% on 7 liters of oxygen via tracheostomy collar.  GENERAL: The patient is sleeping and does not respond to verbal commands. She does open her eyes and withdraw from pain as she apparently has some cramping in her calves when I try to dorsiflex her feet.  HEENT: Normocephalic, atraumatic. Pupils  are equal, round and reactive to light. I cannot check extraocular movements as the patient will not follow my commands, but she did have conjugate gaze the one time I observed her eyes look to the left. The patient's oropharynx is gummy.  NECK: Trachea is midline. Her tracheostomy collar is in place. The stoma is well dressed, and she does not have significant secretions. There is no adenopathy.  CHEST: Symmetric and atraumatic.  CARDIOVASCULAR:  Tachycardic rate, normal rhythm. Normal S1, S2. No rubs, clicks or murmurs appreciated.  LUNGS: Clear to auscultation bilaterally. There is normal effort and excursion.  ABDOMEN: Positive bowel sounds. Soft, nontender, nondistended. No hepatosplenomegaly. Feeding tube is in place.  GENITOURINARY: The patient is wearing a diaper and she had an indwelling catheter that did not appear well taken care of.  MUSCULOSKELETAL: The patient did not voluntarily move any of her extremities on this examination as she had the last time I saw her approximately 1 week ago. She did not cooperate with strength testing.  SKIN: The patient has no rashes or lesions visible; however, nursing reported to me when they rolled the patient, there is a stage 2 decubitus ulcer on her back.  EXTREMITIES: There is no clubbing, cyanosis or edema. The patient does have the beginnings of some contractures in her gastrocnemius muscles bilaterally. There is also a PICC line in place in the right upper extremity.  NEUROLOGIC: The patient opens her eyes and will move her head, which indicates that cranial nerves III and cranial nerve XI are working. She reportedly has a gag reflex, as well, although I have not suctioned her on this examination as I did last week; thus, cranial nerve 10 is presumably intact, as well. She does have 1+ reflexes in her patellar tendons bilaterally.  PSYCHIATRIC: I am unable to assess mood or affect as the patient is non-verbal.   ASSESSMENT AND PLAN: This is a 74 year old female admitted for sepsis due to indwelling urinary catheter infection.   1. Sepsis. The patient meets criteria via fever, heart rate and leukocytosis. She is hemodynamically stable at this time.  2. Urinary catheter associated infection. This infection is presumed to be the extended spectrum beta lactamase Escherichia coli as it was on last admission. Notably, the patient was receiving Invanz infusions at the time that this episode of sepsis  developed. This likely represents some catheter colonization of bacteria and/or inconsistent catheter care. We will discontinue Invanz, at this time, and start cefoxitin as indicated by sensitivities of the last urine culture. We have obtained a new urine culture and will follow sensitivities and adjust antibiotic therapy appropriately as these results become available.  3. Hypernatremia. The patient's sodium is higher than on previous admission despite the reported increase in free water. This may represent inconsistent flushes. While in the hospital, I will increase the patient's flushes to 120 mL/hour based on her free water deficit. We will check her sodium every 6 hours to make sure there is not a rate of rise that is too fast.  4. Dysphagia. We will continue the patient's feeding tube at a continuous infusion rate.  5. Hypothyroidism. The patient's current TSH is within normal limits, which would suggest that we continue Synthroid; however, there is methimazole on her discharge medications. We will do our best to reconcile these results. I do not see any documentation that the patient has hyperthyroidism. Thus, I believe that the methimazole may be an error; however, we will explore this further as it will require  a very thorough review of documentation from the last admission.  6. Hypertension. Continue hydralazine and Coreg.  7. Coronary artery disease. Continue Plavix.  8. Hyperlipidemia. Continue statin therapy for secondary prevention of cardiovascular events.  9. Diabetes type 2. We will continue the patient's basal insulin and I have added  sliding scale insulin, as well. 10. Seizure disorder. Continue Keppra.  11. Nutrition. Continue vitamin C and iron supplementation.  12. Deep vein thrombosis prophylaxis. Continue heparin.  13. Gastrointestinal prophylaxis, Pepcid b.i.d.   CODE STATUS: The patient is a Full Code. We have discussed during the last admission, code status and prognosis, but  the family wishes to have the patient remain a Full Code.   TIME SPENT ON ADMISSION ORDERS AND PATIENT CARE: Approximately 45 minutes.    ____________________________ Kelton Pillar. Sheryle Hail, MD msd:JT D: 08/06/2014 08:41:21 ET T: 08/06/2014 11:12:04 ET JOB#: [id number]  cc: Kelton Pillar. Sheryle Hail, MD, <Dictator> Kelton Pillar Lacoya Wilbanks MD ELECTRONICALLY SIGNED 08/08/2014 0:30

## 2015-01-06 NOTE — Discharge Summary (Signed)
PATIENT NAME:  Michele Guerra, Craig D MR#:  [id number] DATE OF BIRTH:  1940-10-11  DATE OF ADMISSION:  08/06/2014 DATE OF DISCHARGE:  08/14/2014  ADDENDUM:  To earlier dictated interim discharge summary dictated by Dr. Milagros LollSrikar Sudini.   The patient's cultures are finalized and antibiotic is adjusted according to her sensitivity report so she is ready to be discharged back to rehab today and will discharge her back to the nursing home. For further details, please see her interim discharge summary dictated by Dr. Milagros LollSrikar Sudini. ____________________________ Hope PigeonVaibhavkumar G. Elisabeth PigeonVachhani, MD vgv:sb D: 08/14/2014 11:53:25 ET T: 08/14/2014 12:10:34 ET JOB#: [id number]  cc: Hope PigeonVaibhavkumar G. Elisabeth PigeonVachhani, MD, <Dictator> Altamese DillingVAIBHAVKUMAR Chidinma Clites MD ELECTRONICALLY SIGNED 08/18/2014 22:56

## 2015-01-06 NOTE — H&P (Signed)
PATIENT NAME:  Michele Guerra, Michele Guerra MR#:  [id number] DATE OF BIRTH:  February 18, 1941  DATE OF ADMISSION:  07/30/2014  REFERRING PHYSICIAN: Loraine Leriche R. Fanny Bien, M.D.   PRIMARY CARE PHYSICIAN: Dr. Terance Hart  ADMISSION DIAGNOSIS: A urinary tract infection and sepsis.   HISTORY OF PRESENT ILLNESS: This is a 74 year old African American female who presents to the Emergency Department via EMS after noted to have increased oxygen requirement and elevation in her blood pressure at the nursing home to which transferred that day. EMS suctioned a large mucus plug from the patient's trach and her vital signs improved. She went back to her baseline oxygen at 6 liters via trach. In the Emergency Department she was found to be stable but found to have a urinary tract infection and sepsis criteria, which prompted the Emergency Department to call for admission.   Her recent medical history has been complicated by an episode of status epilepticus on 06/13/2014 that resulted in anoxic brain injury. She was placed on mechanical ventilator and finally extubated on 07/04/2014 after [facility] placed [facility]. Her hospital course at that facility is significant for treatment for Clostridium difficile infection and then an extended spectrum beta lactamase E. coli urinary tract infection. She also has completed treatment for hospital-acquired pneumonia with a 10 day course of cefepime that finished on 07/25/2014.   REVIEW OF SYSTEMS: The patient is nonverbal with me today and cannot contribute to her own medical history. Reportedly she has been afebrile. She also reportedly will respond verbally with 1-word answers on occasion and she is able to move her upper extremities, adjust her trach and scratch her face, but is not reliably responsive to verbal questions, but is responsive to touch and pain.   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   PAST SURGICAL HISTORY: Not available at this time.   FAMILY HISTORY: Significant for diabetes and hypertension.   SOCIAL HISTORY: The patient is not a smoker or drinker. She has never smoked. She is a resident of [facility] nursing home right now.  MEDICATIONS: 1. Cholecalciferol 50,000 units 1 tablet via G-tube every Wednesday.  2. Ascorbic acid 500 mg 1 tablet via tube 2 times a day.  3. Coreg 12.5 mg 1 tab via tube 2 times a day.  4. Lovenox 40 mg 1 injection subcutaneously daily.  5. Flomax 0.4 mg 1 capsule via G-tube daily for neurogenic bladder.  6. Lipitor 10 mg 1 tablet via tube 1 time a day.  7. Plavix 75 mg 1 tab via tube daily.  8. Methimazole 5 mg 3 tablets via tube every 8 hours.  9. Multivitamin liquid 5 mL via tube 1 time a day.  10. Keppra 750 mg by tube 2 times a day.  11. Hydralazine 25 mg 1 tablet via tube 3 times a day.  12. Pepcid 20 mg 1 tablet via tube 2 times a day.  13. Ferrous sulfate syrup 300 mg per 5 mL, 5 mL via tube 2 times a day.  14. Peridex solution 15 mL by mouth every morning and at bedtime for mouth care.  15. Levemir flexpen inject 35 units subcutaneously 2 times a day. 16. Magnesium 400 mg via G-tube 2 times a day.   ALLERGIES: VERAPAMIL, ATIVAN, HALDOL AND LOTENSIN.   PERTINENT LABORATORY RESULTS AND RADIOGRAPHIC FINDINGS: Serum glucose is 182. BUN is 48, creatinine 0.88, sodium  is 146, potassium is 4.6, chloride is 107, bicarbonate 32, calcium is 10.9, phosphorus is 2.6, magnesium is 2.1, albumin 2.5, alkaline phosphatase 115, AST is 51, ALT is 183. Troponin is negative. White blood cell count is 21, hemoglobin is 9.8, hematocrit 30, platelet count 712,000. Urinalysis shows 3558 white blood cells per high-power field, 3+ bacteria. Venous blood gas shows a pH of 7.44, with a pCO2 of 51, and lactic acid of 1.5. Chest x-ray shows  nonspecific left retrocardiac opacity, which may reflect atelectasis or infiltrate. There is a stable position of the tracheostomy tube.   PHYSICAL EXAMINATION: VITAL SIGNS: Temperature is 100.6, pulse is 115, blood pressure is 160/84, pulse oximetry 100% on 6 liters of oxygen via trach.  GENERAL: The patient is awake at the time of my exam, but unresponsive to questions. She will withdraw to pain and localized pain.  HEENT: Normocephalic, atraumatic. Pupils are equal, round, and reactive to light and accommodation. Extraocular movements are intact. Mucous membranes are mildly tacky. NECK: Trachea is midline. Trach collar is in place and cannula is present. No adenopathy.  CHEST: Symmetric and atraumatic.  CARDIOVASCULAR: Tachycardic rate, normal rhythm. Normal S1, S2. No rubs, clicks, or murmurs.  LUNGS: Clear to auscultation bilaterally. Normal effort and excursion.  ABDOMEN: Positive bowel sounds. Soft, nontender, nondistended. No hepatosplenomegaly.  GENITOURINARY: The patient is wearing a diaper.  MUSCULOSKELETAL: I have not observed the patient move her legs but she has moved her right arm while I was in the room. She does not cooperate with strength testing.  SKIN: No rashes or lesions. I did not roll the patient to check for breakdown decubitus ulcers yet.  EXTREMITIES: No clubbing, cyanosis, or edema.  NEUROLOGIC: Cranial nerve V, III as well as II and presumably X as well are all intact as the patient has been seen to move her eyes, her face and she has a gag reflex. I am unable to test other cranial nerves. She has symmetric 1+ reflexes in her patella tendons bilaterally.  PSYCHIATRIC: I am unable to assess mood or affect.   ASSESSMENT AND PLAN: This is a 74 year old female admitted for urinary tract infection, sepsis and respiratory care.   1. Urinary tract infection. The patient previously had an extended spectrum beta lactamase Escherichia coli. She has been started on vancomycin  and ertapenem in the Emergency Department. We have obtained urine cultures and will adjust her antibiotics per sensitivities as they develop. The patient had been placed on Flomax for neurogenic bladder, but I question if this is the most appropriate drug. We may want to use a spasmodic agent. Urology consult at the discretion of the primary team.  2. Sepsis. The patient meets criteria via temperature, heart rate and leukocytosis. She is hemodynamically stable at this time.  3. Tracheostomy. Mucus plugging prior to arrival has resolved with suctioning. The patient has a good cough and reportedly minimal secretions. We will continue trach care with suctioning at least 4 times a day.  4. Hypernatremia, mild with serum sodium level of 146. We will increase the patient's free water flushes to 250 mL t.i.d.  5. Dysphagia. The patient is currently receiving continuous feeds per tube. She has a history of pneumonitis secondary to aspiration, but she has been stable from a respiratory standpoint, at least since 5 days ago when she completed a course of antibiotics for pneumonia. I have continued the patient's H2-blocker, which hopefully will help prevent further aspiration pneumonia. Proper positioning in bed will also be a  precaution for aspiration.  6. Hypothyroidism. I have ordered a TSH to check the accuracy of this diagnosis. The patient is on methimazole per her medication administration record, which, of course, is a medicine for hyperthyroidism or overactive thyroid. The patient may be developing hypothyroidism, but it could be in a period of thyroiditis, such as Hashimoto's, or this could simply be an error in transcription. I will order a free T4 and we can check T3 if necessary.  7. Hypertension. Acceptable at this time. We will continue hydralazine and Coreg.  8. Coronary artery disease. This problem is not listed on the patient's past medical history, but she is on Plavix. We will continue the patient's  antiplatelet medication. She has no evidence of myocardial ischemia at this time.  9. Hyperlipidemia. We will continue statin therapy for secondary prevention of coronary artery disease.  10. Diabetes type 2. We will continue the patient's basal insulin and add sliding scale insulin as needed.  11. Seizure disorder. Continue Keppra. The patient had been on Vimpat,  but appeared to be more alert once that medicine was discontinued.  12. Nutrition. We will continue vitamin C supplementation as well as her iron supplementation.  13. Deep vein thrombosis prophylaxis. Lovenox.  14. Gastrointestinal prophylaxis. Pepcid b.i.d.   CODE STATUS: The patient is a full code.   TIME SPENT ON ADMISSION ORDERS AND PATIENT CARE: Approximately 45 minutes.    ____________________________ Kelton PillarMichael S. Sheryle Hailiamond, MD msd:dw D: 07/30/2014 08:30:38 ET T: 07/30/2014 09:55:12 ET JOB#: [id number]  cc: Kelton PillarMichael S. Sheryle Hailiamond, MD, <Dictator> Kelton PillarMICHAEL S Raye Wiens MD ELECTRONICALLY SIGNED 08/03/2014 2:00

## 2015-01-06 NOTE — Consult Note (Signed)
Brief Consult Note: Diagnosis: Leukocytosis, UTI, dementia, abdominal distention, possible aspiration.   Patient was seen by consultant.   Consult note dictated.   Recommend further assessment or treatment.   Orders entered.   Discussed with Attending MD.   Comments: Check cxr and abd xray If has diarrhea send C diff.  Electronic Signatures: Dierdre HarnessFitzgerald, Kyree Adriano Patrick (MD)  (Signed 12-Aug-15 15:37)  Authored: Brief Consult Note   Last Updated: 12-Aug-15 15:37 by Dierdre HarnessFitzgerald, Bethany Cumming Patrick (MD)

## 2015-01-06 NOTE — Op Note (Signed)
PATIENT NAME:  Michele Guerra, Michele Guerra MR#:  [id number] DATE OF BIRTH:  01-09-1941  DATE OF PROCEDURE:  04/17/2014  PREOPERATIVE DIAGNOSIS: Cataract, left eye.   POSTOPERATIVE DIAGNOSIS: Cataract, left eye.   PROCEDURE PERFORMED: Extracapsular cataract extraction using phacoemulsification with placement of an Alcon [id number], 21.5-diopter posterior chamber lens, serial #[id number].   SURGEON: Maylon PeppersSteven A. Jeziel Hoffmann, MD  ASSISTANT: None.   ANESTHESIA: 4% lidocaine with 0.75% Marcaine in a 50-50 mixture with 10 units/mL of Hylenex added, given as a peribulbar.  ANESTHESIOLOGIST: Gjibertus F. Darleene CleaverVan Staveren, MD   COMPLICATIONS: None.   ESTIMATED BLOOD LOSS: Less than 1 mL.   DESCRIPTION OF PROCEDURE: The patient was brought to the operating room and given IV sedation and a peribulbar block. She was then prepped and draped in the usual fashion. The vertical rectus muscles were imbricated using 5-0 silk sutures as bridle sutures. The hemostasis was obtained with cautery at the limbus superiorly and a partial-thickness scleral groove was made at the surgical limbus. This was dissected anteriorly into clear cornea with an Alcon crescent knife. The anterior chamber was entered superonasally and superotemporally through clear cornea with a paracentesis knife. Air was used to place the aqueous and VisionBlue was used to paint the anterior capsule. The air was replaced with balanced salt and the anterior chamber was entered through the lamellar dissection with a 2.6 mm keratome. Viscoat was used to replace the aqueous and a continuous tear circular capsulorrhexis was carried out. Hydrodissection was used to loosen the nucleus. Phacoemulsification was carried out in a divide-and-conquer technique. Ultrasound time was 2 minutes 33 seconds with an average power of 27.6% and a CDE of 70.83. Irrigation-aspiration was used to remove the residual cortex. The capsular bag was inflated with DisCoVisc and the intraocular lens  was inserted in the capsular bag using an AcrySert delivery system. Irrigation and aspiration was used to remove the residual DisCoVisc and the wound was inflated with balanced salt. The wound was checked for leaks and there was a small amount of leakage noted. Miostat was injected through the paracentesis track and then a single 10-0 nylon suture was placed across the wound to seal it. Then 0.10 mL of cefuroxime containing 1 mg of drug was injected via the paracentesis track. The wound again was checked for leaks. None were found. The bridle sutures were removed. Two drops of Vigamox were placed in the eye. A shield was placed on the eye. The patient was discharged to the recovery room in good condition    ____________________________ Maylon PeppersSteven A. Allysha Tryon, MD sad:sk Guerra: 04/17/2014 13:49:51 ET T: 04/18/2014 00:30:40 ET JOB#: [id number]  cc: Viviann SpareSteven A. Judaea Burgoon, MD, <Dictator> Erline LevineSTEVEN A Mesiah Manzo MD ELECTRONICALLY SIGNED 04/24/2014 13:04

## 2015-01-10 NOTE — H&P (Signed)
PATIENT NAME:  Michele Guerra, Michele Guerra MR#:  [id number] DATE OF BIRTH:  24-Feb-1941  DATE OF ADMISSION:  09/03/2014  ADMITTING PHYSICIAN:  Enid Baasadhika Damarkus Balis, MD   PRIMARY CARE PHYSICIAN: Dorothey Basemanavid [facility].   CHIEF COMPLAINT: Increased respiratory distress and fever.   HISTORY OF PRESENT ILLNESS: [name] is a 74 year old unfortunate African American female with past medical history significant for chronic respiratory failure status post trach on 4 to 6 liters nasal cannula with chronic respiratory failure status post trach 4-6 liters oxygen, anoxic brain injury with obtunded mental status at baseline, severe dementia, hypertension, hyperlipidemia, hyperthyroidism, insulin-dependent diabetes mellitus, history of seizures in the past, recent admission three weeks ago and had MRSA pneumonia and VRE, UTI, atherosclerotic heart disease.  [facility] secondary to increased respiratory distress and fevers. The patient at baseline, is only alert and can track at sometimes, but not able to communicate or move her extremities since her anoxic brain injury following status epilepticus in October 2015.  The patient appears to be slightly tachypneic at this time. She is on 4 liters of oxygen via tracheostomy that she has chronically and she has increased secretions from her mouth, thick mucousy, yellowish colored.  She had a temperature of 100 degrees Fahrenheit at the nursing home and currently none here. She has an elevated white count of 27,000 and is being admitted under sepsis protocol. She has a chronic Foley catheter, which appears to be draining thick mucousy pus kind of urine so that catheter had been changed and cultures have been sent.  The patient also has a stage III sacral decub which is cleaned and wound cultures are being sent.   PAST MEDICAL HISTORY:  1.  Severe dementia.  2.  Anoxic brain injury with obtunded mental status at baseline.   [id number]

## 2015-01-10 NOTE — Discharge Summary (Signed)
PATIENT NAME:  Michele Guerra, Michele Guerra MR#:  [id number] DATE OF BIRTH:  05/15/1941  DATE OF ADMISSION:  09/03/2014 DATE OF DISCHARGE:  09/11/2014  DISCHARGING PHYSICIAN: Michele Baasadhika Juanya Villavicencio, MD.   PRIMARY CARE PHYSICIAN: Michele Iraniavid M. Terance HartBronstein, MD at [facility].  CONSULTATIONS IN THE HOSPITAL: ID consultation by Dr. Sampson Guerra.    DISCHARGE DIAGNOSES:  1.  Sepsis.  2.  Acute on chronic respiratory failure.  3.  Klebsiella pneumoniae and extended spectrum-beta-lactamase Escherichia coli urinary tract infection.  4.  Chronic Foley catheter.  5.  Sacral decubitus ulcer.  6.  Klebsiella and proteus in the sacral decubitus infection.  7.  Methicillin-resistant Staphylococcus aureus pneumonia and Proteus pneumonia.  8.  Proteus and methicillin-resistant Staphylococcus aureus bacteremia.  9.  Anoxic encephalopathy with total care at baseline.  10.  History of vancomycin-resistant Enterococcus urinary tract infection in November 2015 and methicillin-resistant Staphylococcus aureus in November 2015.  11.  Dementia.  12.  Diabetes mellitus.  13.  Multiple admissions for recurrent infections.  14.  History of seizure disorder.  15.  Chronic respiratory failure, status post tracheostomy on supplemental oxygen forced inspiratory oxygen 28%.  16.  Hypertension.  17.  Hyperlipidemia.  18.  Hyperthyroidism.   DISCHARGE MEDICATIONS:  1. Lovenox 40 mg subcutaneously daily for prophylaxis.  2. Multivitamin liquid 5 mL once a day.  3. Cholecalciferol 50,000 international units once a week on Wednesdays.  4. Plavix 75 mg p.o. daily.  5. Atorvastatin 10 mg p.o. at bedtime.  6. Methimazole 15 mg q. 8 hours.  7. Peridex mucous membrane liquid 15 mL twice a day.  8. NovoLog sliding scale insulin as instructed in the discharge instructions.  9. Famotidine 20 mg oral twice a day.  10. Ferrous sulfate 300 mg/5 mL twice a day.  11. Ascorbic acid 500 mg p.o. b.i.Guerra.  12. Coreg 12.5 mg p.o. twice a day.   13. Magnesium oxide 400 mg p.o. b.i.Guerra.  14. Tylenol 650 mg q. 4 hours p.r.n. for pain.  15. Colace 100 mg p.o. b.i.Guerra.  16. Budesonide 0.5 mg/2 mL inhalation, 2 mL inhaled twice a day.  17. Levemir FlexPen 15 units subcutaneous twice a day.  18. Keppra 100 mg/mL, 7.5 mL solution orally every 12 hours.  19. Vancomycin 750 mg IV q. 12 hours for 12 more days until 09/22/2014.  20. Ertapenem 1 gram IV q. 24 hours for 12 days, until 09/22/2014.  21. Tube feeds, Glucerna 1.5 calories at 45 mL an hour with 15 mL free water flushes every hour.  22. Extra of 250 mL free water flushes via gastric tube twice a day.   23. Home oxygen via tracheostomy aerosol mask, FiO2 at 28%.    FOLLOWUP INSTRUCTIONS:  1.  ID followup in 2 weeks.  2.  PCP followup in 1-2 weeks.  3.  Every 2 hour suctioning of mouth, and every 1 hour as needed suctioning. Repositioning needed in bed and wound care followup for sacral decubitus ulcer needed.   LABORATORIES AND IMAGING STUDIES PRIOR TO DISCHARGE: As mentioned in the discharge diagnosis, cultures were positive. Blood cultures from 09/03/2014 growing Proteus mirabilis and MRSA. Urine cultures are growing Klebsiella and ESBL Escherichia coli. Sputum cultures are growing Proteus and MRSA. Wound cultures are growing Proteus, Escherichia coli and Klebsiella. Clostridium difficile is negative. Urine cultures with Klebsiella and ESBL  Escherichia coli. Repeat blood cultures from 09/08/2014 are negative.   WBC 11.9, hemoglobin 8.4, hematocrit 26.6, platelet count 477,000. Sodium 143 , potassium 4.0, chloride 107,  bicarbonate 30, BUN 12, creatinine 0.48, glucose 163, calcium of 9.0.   Echocardiogram Doppler showing LV ejection fraction of 65% to 70%, hyperdynamic systolic function, impaired relaxation of LV diastolic filling, mild concentric LVH noted. Chest x-ray on 09/09/2014 showing stable bilateral airspace disease, normal heart size.   BRIEF HOSPITAL COURSE: [name] is a  74 year old unfortunate African American female with a past medical history significant for CVA, dementia, prolonged hospitalization here at [facility] in August 2015 requiring tracheostomy, was discharged to [facility]; had tonic-clonic seizures, status epilepticus which resulted in anoxic brain injury; currently with chronic respiratory failure; status post tracheostomy and supplemental oxygen; alertness at baseline with eyes open, not following commands and aphasic at baseline; total care who is from [facility], was brought in secondary to fevers and increased secretions and respiratory distress. Noted to be septic from multiple sources:   1.  Recently just discharged 2-3 weeks prior to this admission. At the time, she had MRSA pneumonia and VRE UTI. Placed on Zyvox at the time. Now, she comes back. Chest x-ray showed new infiltrate, so she has healthcare-acquired pneumonia. Her sputum cultures were growing MRSA and Proteus. She has a stage III-IV sacral decubitus ulcer, which cultures are growing Proteus, Klebsiella and Escherichia coli. Blood cultures are growing Proteus and MRSA, and her urine cultures are growing Escherichia coli, ESBL and also Klebsiella. Multiple antibiotics were changed until sensitivities are back; finally being discharged on vancomycin and Invanz for 2 weeks since the blood cultures are negative. Clinically, no significant improvement changes were noted as the patient is at baseline. She does have increased tracheal secretions at baseline, which needs very frequent suctioning. She also had mucous plug clogging in the past.  For her sacral decubitus ulcer, orders were written to follow up with wound care and also frequent position changing in bed. The patient is prone to multiple infections in the near future as well.  2.  Diabetes mellitus with hyperglycemia. On admission was on insulin drip briefly, however, improved right now on Levemir and sliding scale insulin.   3.  Hypernatremia secondary to dehydration and decreased free water on admission, resolved with free water flushes and D5 briefly in the hospital and D5 flushes are increased after tube feeds at the time of discharge.  4.  Anoxic encephalopathy, history of seizures. Her Keppra was continued. She is at baseline. Eyes open, talking, not following commands, but aphasic.  5.  All her other home medications are being continued. Son was approached again during the hospital stay about CODE STATUS discussion followed by palliative care as well, but no further changes at this time.   DISCHARGE CONDITION: Guarded.   DISCHARGE DISPOSITION: To [facility].   TIME SPENT ON DISCHARGE: 45 minutes.   CODE STATUS: Full code.    ____________________________ Michele Baas, MD [phone] Guerra: 09/11/2014 14:46:00 ET T: 09/11/2014 15:08:47 ET JOB#: [id number]  cc: Michele Baas, MD, <Dictator> Michele Irani. Terance Hart, MD Stann Mainland. Michele Goon, MD  Michele Baas MD ELECTRONICALLY SIGNED 10/02/2014 16:11

## 2015-09-30 IMAGING — CR DG CHEST 1V PORT
1 series · 1 of 1 positions shown · non-contrast
Comparison: 06/19/2014.

CLINICAL DATA: Intubation.  Followup encounter.

EXAM:
PORTABLE CHEST - 1 VIEW

[ap]
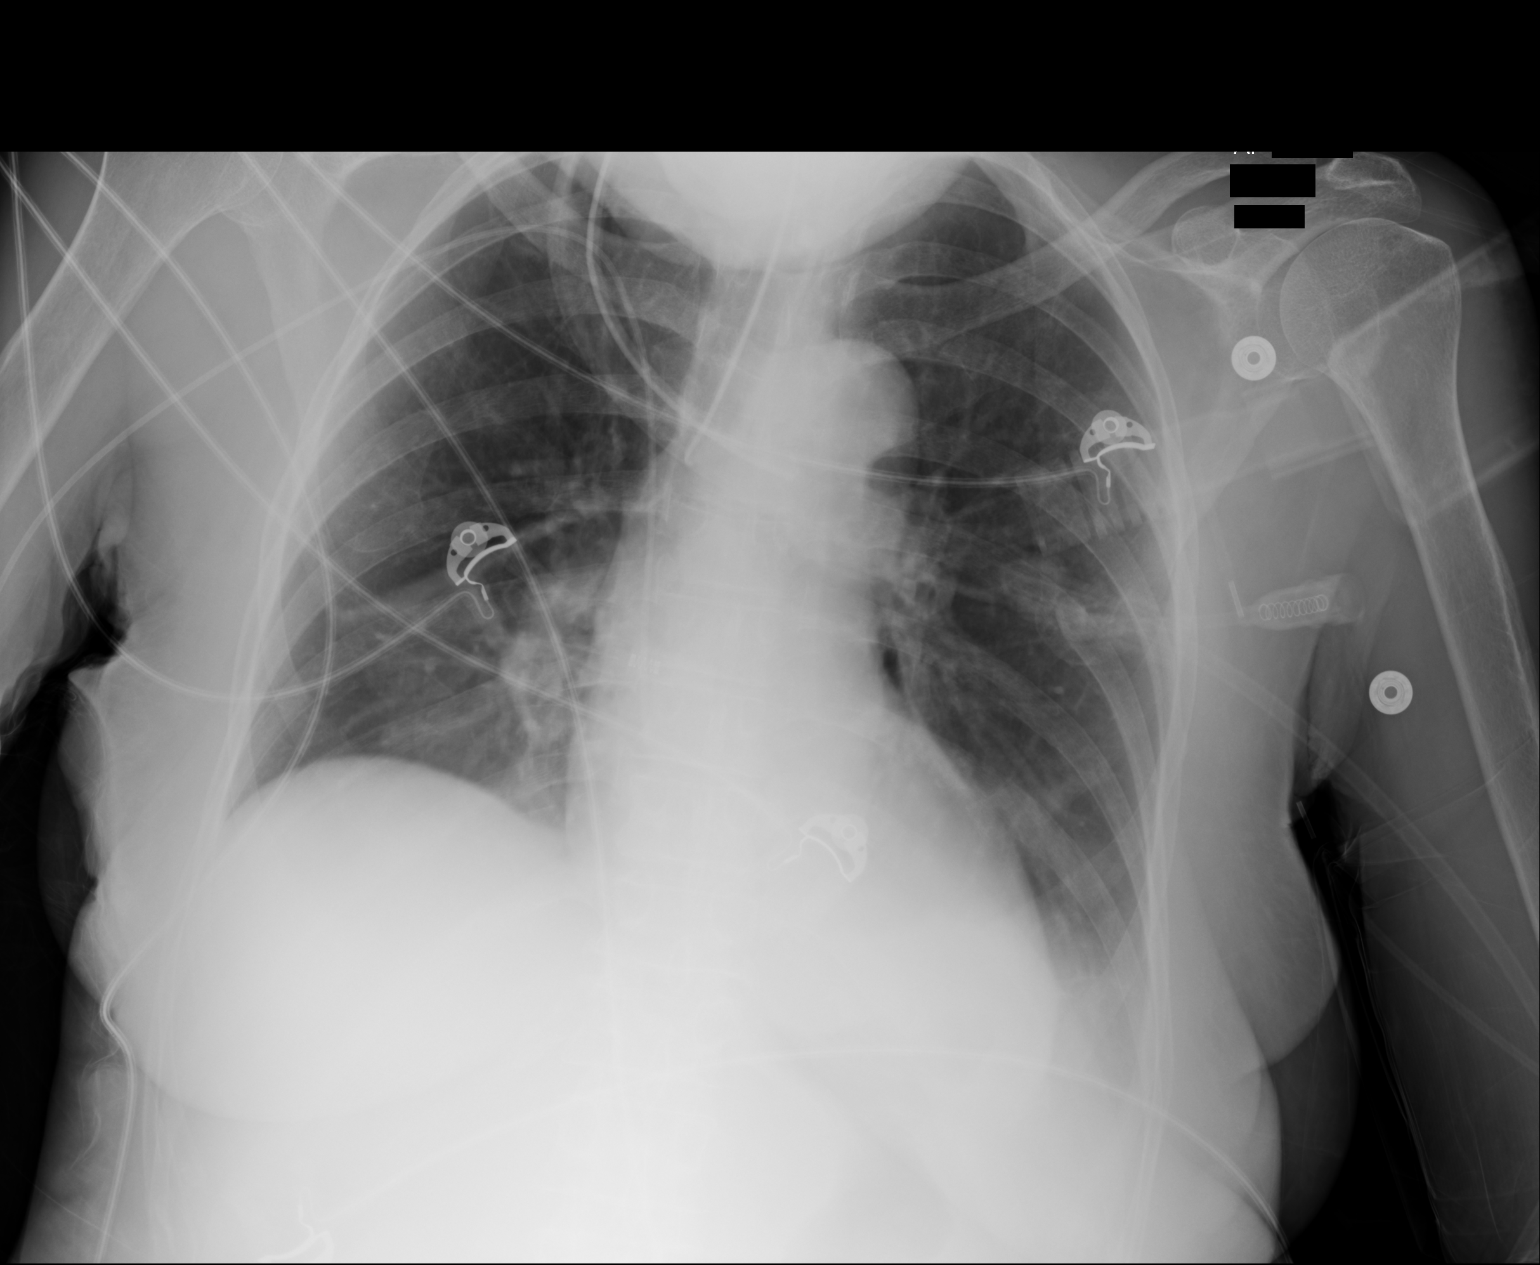

[1 of 1 positions shown; findings below may reference images not displayed]

FINDINGS: Endotracheal tube is been withdrawn slightly. The endotracheal tube
tip is now 1 cm above the carina. Proximal repositioning should be
considered. PICC line in stable position. Bibasilar mild atelectasis
and/or infiltrate. Small left pleural effusion. No pneumothorax.
Heart size is normal. Pulmonary vascularity normal. No acute bony
abnormality.
IMPRESSION: 1. Interim slight withdrawal of endotracheal tube, its tip is 1 cm
above the carina. Slight proximal repositioning should be
considered.
2. PICC line stable position.
3. Bibasilar atelectasis and/or mild infiltrates. Small left pleural
effusion cannot be excluded .

## 2015-10-02 IMAGING — CR DG CHEST 1V PORT
1 series · 1 of 1 positions shown · non-contrast
Comparison: 06/19/2014

CLINICAL DATA: Respiratory failure. Hypertension. Diabetes.
Dementia.

EXAM:
PORTABLE CHEST - 1 VIEW

[portable]
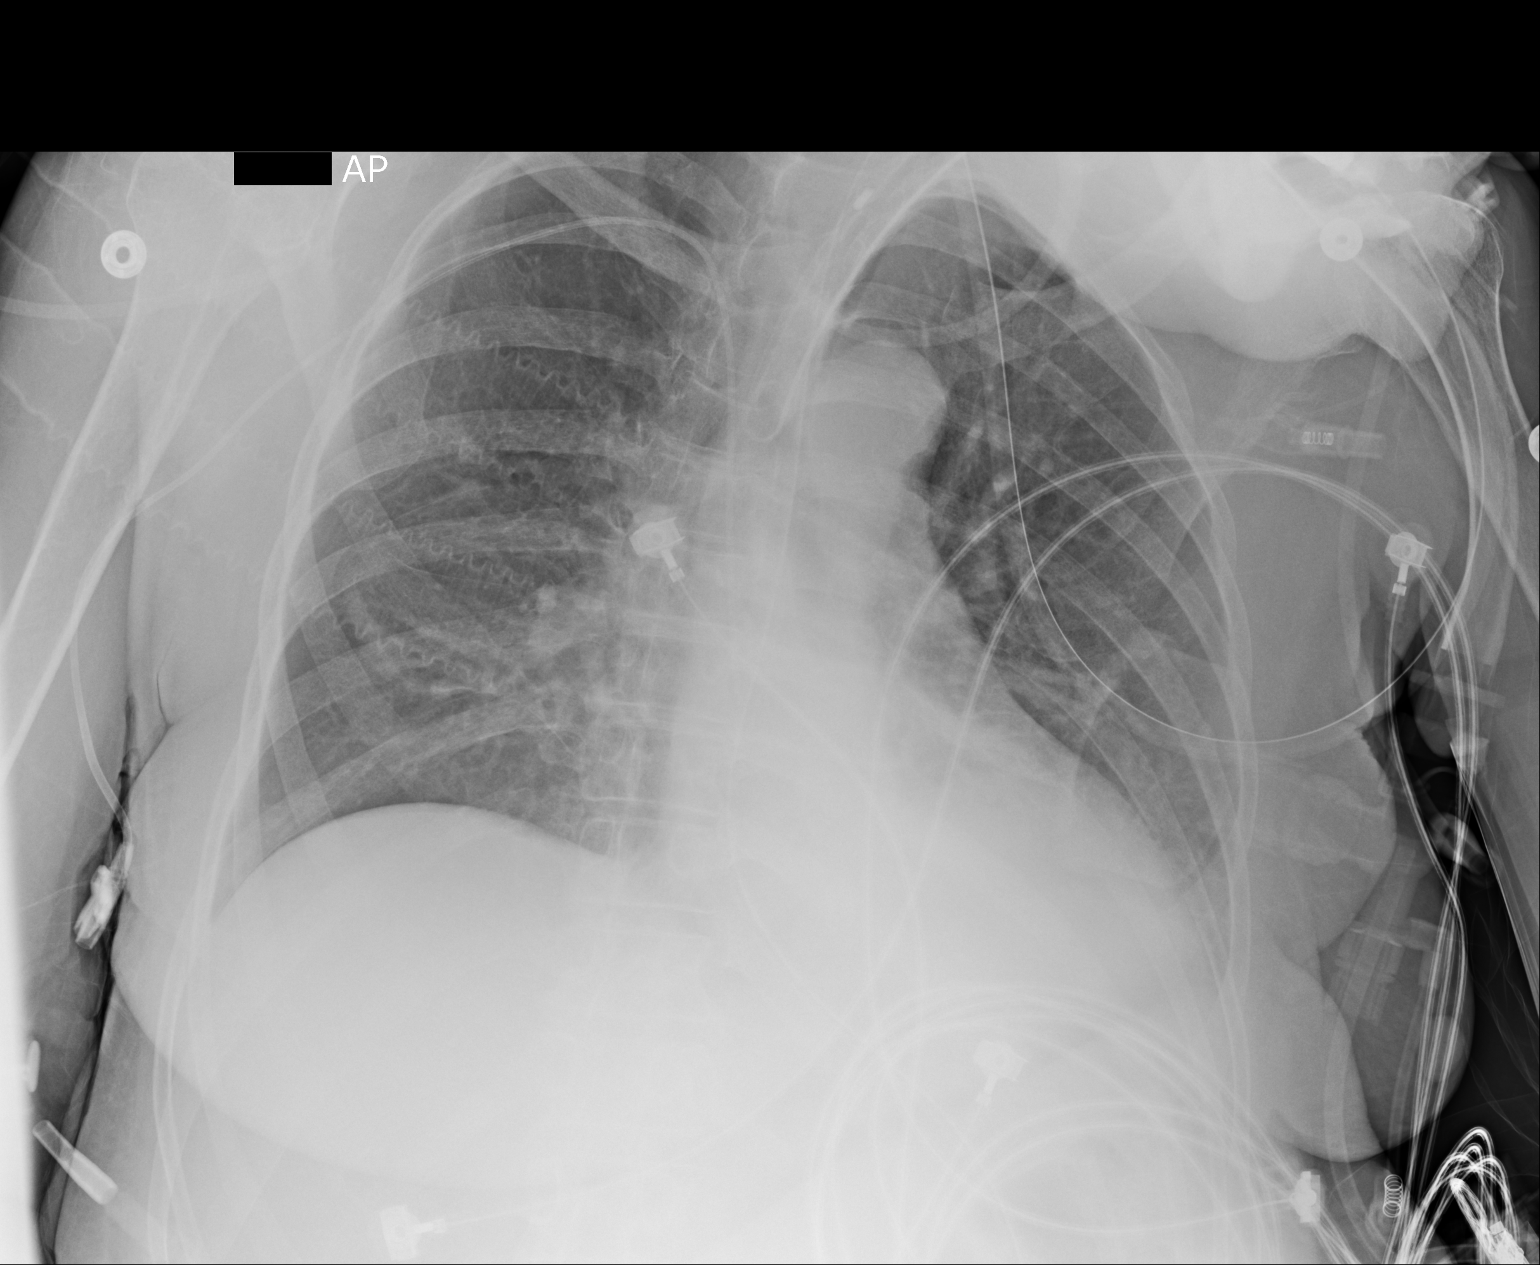

[1 of 1 positions shown; findings below may reference images not displayed]

FINDINGS: The patient is rotated to the Left on today's radiograph, reducing
diagnostic sensitivity and specificity. Endotracheal tube is 10 mm
above the carina. Consider retracting 1.5 cm.

Continued left basilar airspace opacity with obscuration of left
hemidiaphragm. Heart size within normal limits.

Right central line tip: Lower SVC. A nasogastric tube enters the
stomach.
IMPRESSION: 1. Stable appearance is chest. The endotracheal tube is only 1 cm
above the carina, consider retracting 1.5 cm.
2. Stable left lower lobe airspace opacity, nonspecific, potentially
from atelectasis, layering pleural effusion, or pneumonia.

## 2015-10-03 IMAGING — CT CT HEAD W/O CM
1 series · 15 of 30 positions shown, 19 images · non-contrast
Comparison: Multiple priors, most recently 06/16/2014.

CLINICAL DATA: 73-year-old female with altered mental status.
Unresponsive patient.

EXAM:
CT HEAD WITHOUT CONTRAST
TECHNIQUE: Contiguous axial images were obtained from the base of the skull
through the vertex without intravenous contrast.

[Series 2: head 5.0 h31s · axial · 0.43mm/px · z∈[-84,+61]mm · 15 of 33 slices shown, 19 images]
[im 2/33  brain]
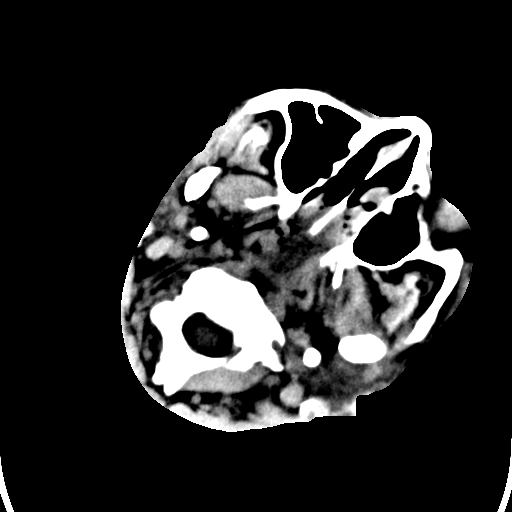
[im 2/33  bone]
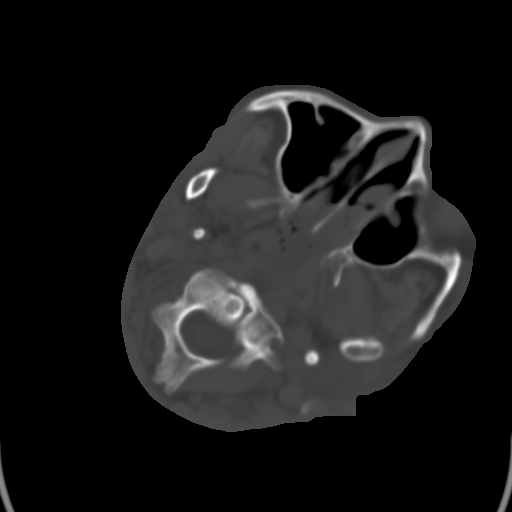
[im 4/33  brain]
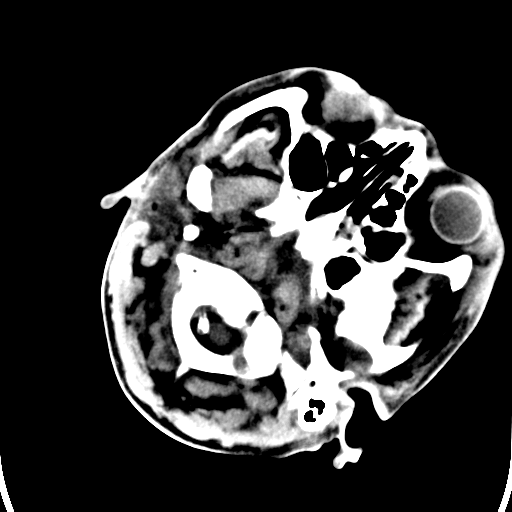
[im 6/33  brain]
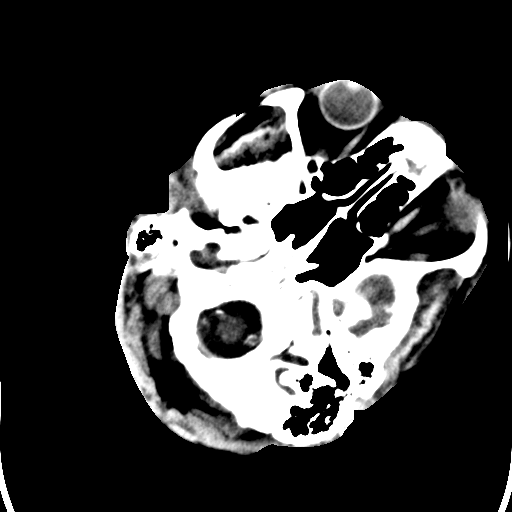
[im 8/33  brain]
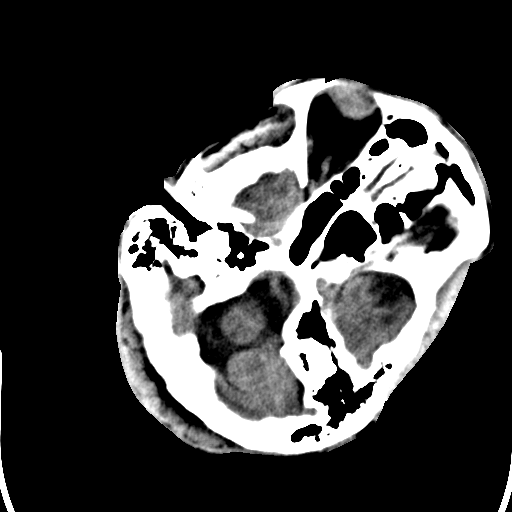
[im 10/33  brain]
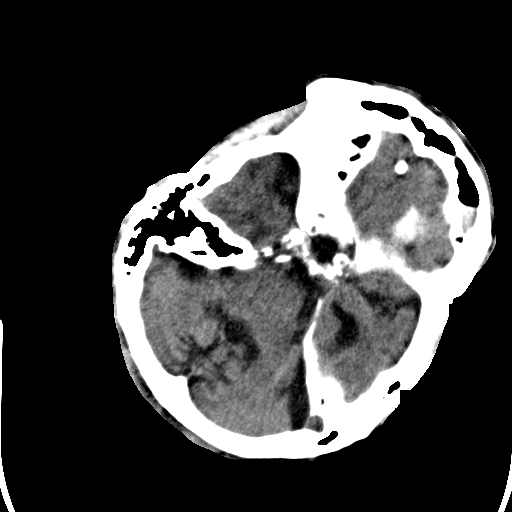
[im 10/33  bone]
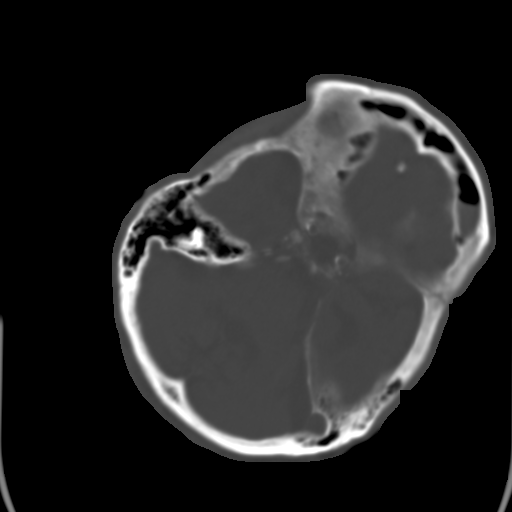
[im 13/33  brain]
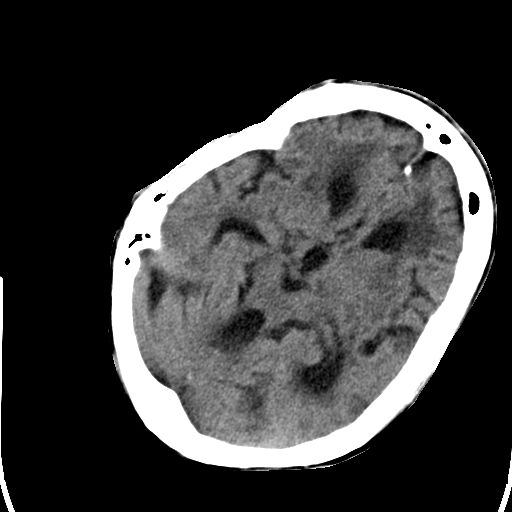
[im 15/33  brain]
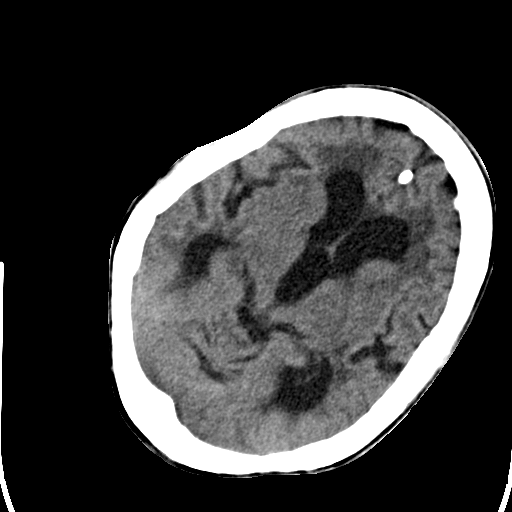
[im 17/33  brain]
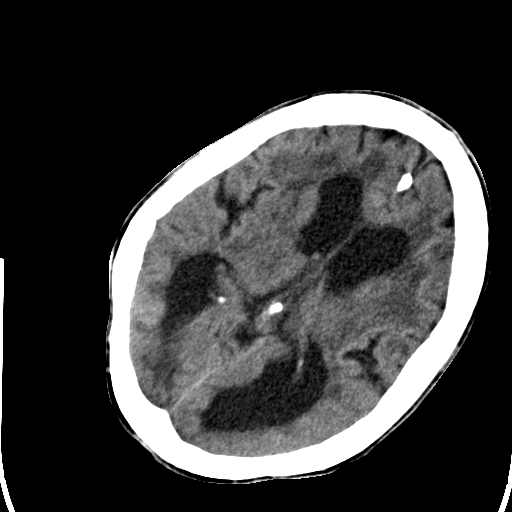
[im 18/33  brain]
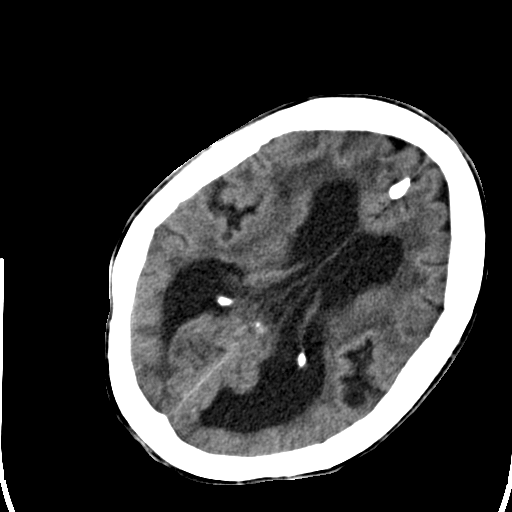
[im 18/33  bone]
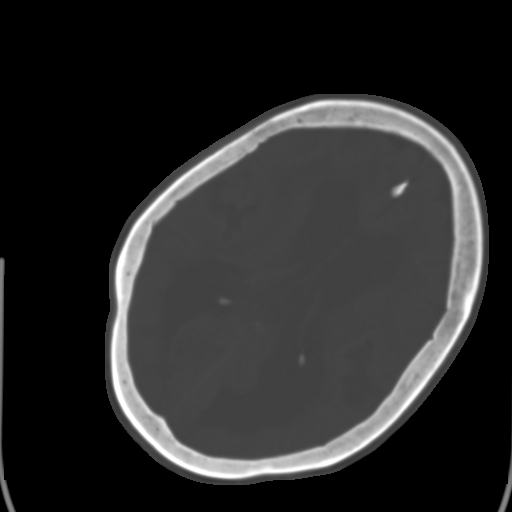
[im 20/33  brain]
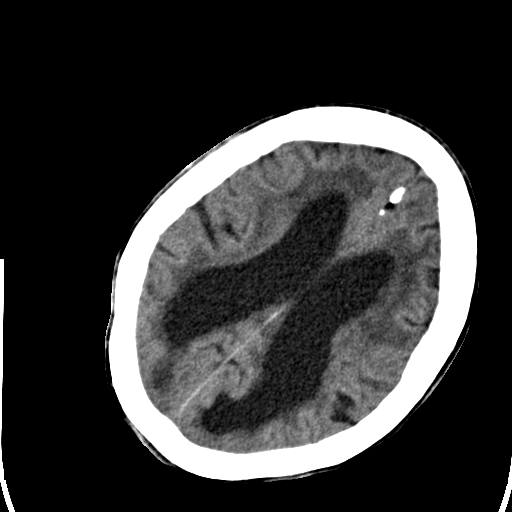
[im 23/33  brain]
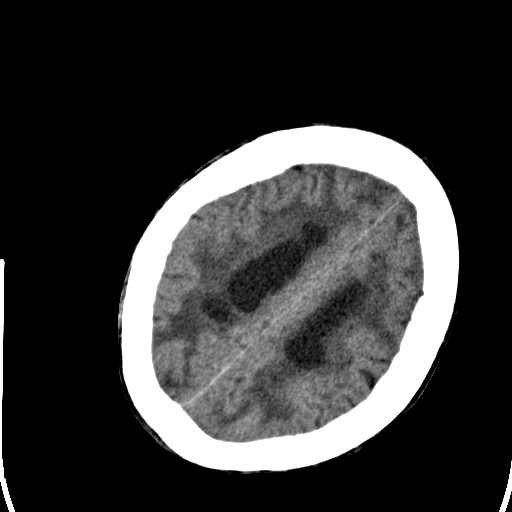
[im 25/33  brain]
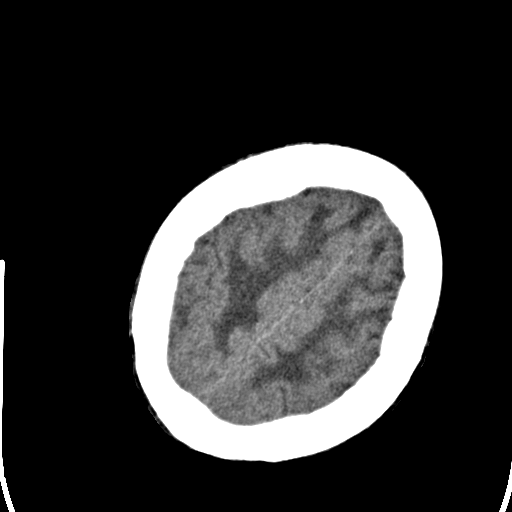
[im 27/33  brain]
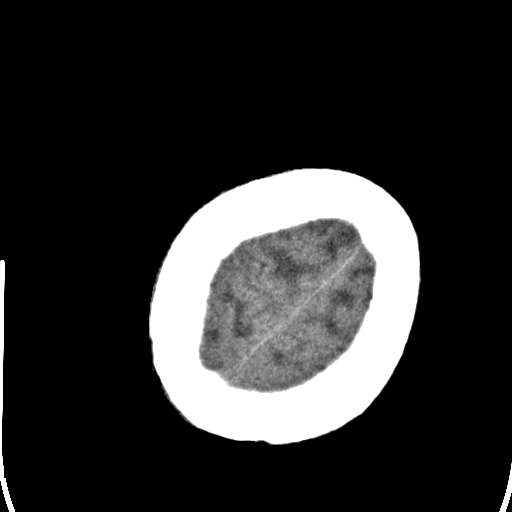
[im 27/33  bone]
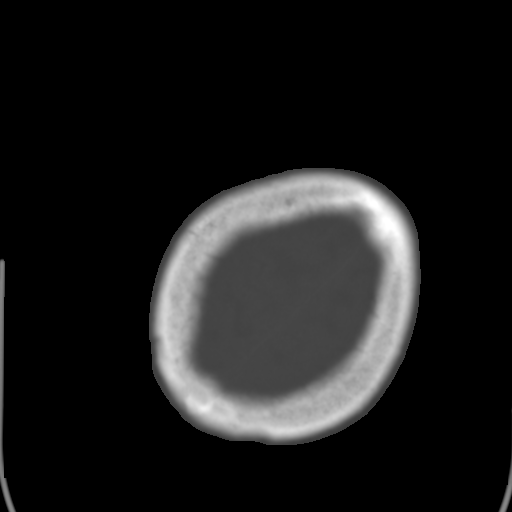
[im 29/33  brain]
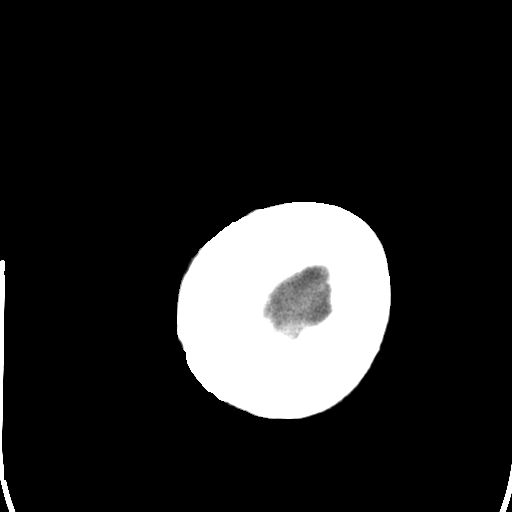
[im 31/33  brain]
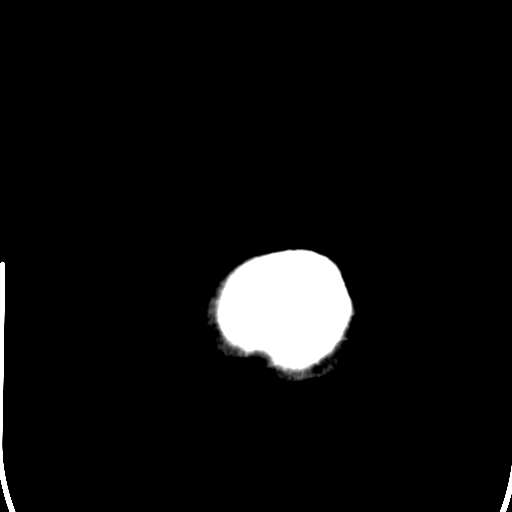

[15 of 30 positions shown; findings below may reference images not displayed]

FINDINGS: Again noted is severe cerebral and cerebellar atrophy with some ex
vacuo dilatation of the ventricular system. However, there is also
rounding of the temporal horns, rounding of the third ventricle and
dilatation of the fourth ventricle, all of which appears chronic
compared to prior studies, and are suggestive of underlying chronic
communicating hydrocephalus. Patchy and confluent areas of decreased
attenuation are noted throughout the deep and periventricular white
matter of the cerebral hemispheres bilaterally, compatible with
chronic microvascular ischemic disease. No acute intracranial
abnormalities. Specifically, no evidence of acute/subacute ischemia,
no acute intracranial hemorrhage, no mass, mass effect or other
abnormal intra or extra-axial fluid collections. No acute displaced
skull fractures are identified. Mastoids are well pneumatized
bilaterally. Multifocal mucosal thickening in the ethmoid sinuses
bilaterally, frontal sinuses bilaterally, and some opacification of
the left frontal sinus and frontoethmoidal recess, similar to the
prior examination. 1 cm osteoid osteoma the of the left
frontoethmoidal recess is unchanged.
IMPRESSION: 1. No acute findings.
2. Severe cerebral and cerebellar atrophy with extensive chronic
microvascular ischemic changes in the cerebral white matter and
evidence of probable chronic communicating hydrocephalus
redemonstrated, as above.
3. Paranasal sinus disease, as above, similar to prior examination.
